# Patient Record
Sex: Female | Born: 1945 | Race: White | Hispanic: No | Smoking: Former smoker
Health system: Southern US, Community
[De-identification: ages and names within clinical notes are randomized; demographics above are authoritative.]

## PROBLEM LIST (undated history)

## (undated) DIAGNOSIS — F32A Depression, unspecified: Secondary | ICD-10-CM

## (undated) DIAGNOSIS — R51 Headache: Secondary | ICD-10-CM

## (undated) DIAGNOSIS — D699 Hemorrhagic condition, unspecified: Secondary | ICD-10-CM

## (undated) DIAGNOSIS — J449 Chronic obstructive pulmonary disease, unspecified: Secondary | ICD-10-CM

## (undated) DIAGNOSIS — Z87442 Personal history of urinary calculi: Secondary | ICD-10-CM

## (undated) DIAGNOSIS — E119 Type 2 diabetes mellitus without complications: Secondary | ICD-10-CM

## (undated) DIAGNOSIS — J45909 Unspecified asthma, uncomplicated: Secondary | ICD-10-CM

## (undated) DIAGNOSIS — I82409 Acute embolism and thrombosis of unspecified deep veins of unspecified lower extremity: Secondary | ICD-10-CM

## (undated) DIAGNOSIS — G4730 Sleep apnea, unspecified: Secondary | ICD-10-CM

## (undated) DIAGNOSIS — A4902 Methicillin resistant Staphylococcus aureus infection, unspecified site: Secondary | ICD-10-CM

## (undated) DIAGNOSIS — K219 Gastro-esophageal reflux disease without esophagitis: Secondary | ICD-10-CM

## (undated) DIAGNOSIS — M1990 Unspecified osteoarthritis, unspecified site: Secondary | ICD-10-CM

## (undated) DIAGNOSIS — Z85528 Personal history of other malignant neoplasm of kidney: Secondary | ICD-10-CM

## (undated) DIAGNOSIS — E785 Hyperlipidemia, unspecified: Secondary | ICD-10-CM

## (undated) DIAGNOSIS — R519 Headache, unspecified: Secondary | ICD-10-CM

## (undated) DIAGNOSIS — N200 Calculus of kidney: Secondary | ICD-10-CM

## (undated) DIAGNOSIS — I10 Essential (primary) hypertension: Secondary | ICD-10-CM

## (undated) DIAGNOSIS — C801 Malignant (primary) neoplasm, unspecified: Secondary | ICD-10-CM

## (undated) DIAGNOSIS — T4145XA Adverse effect of unspecified anesthetic, initial encounter: Secondary | ICD-10-CM

## (undated) DIAGNOSIS — F329 Major depressive disorder, single episode, unspecified: Secondary | ICD-10-CM

## (undated) HISTORY — DX: Sleep apnea, unspecified: G47.30

## (undated) HISTORY — DX: Personal history of other malignant neoplasm of kidney: Z85.528

## (undated) HISTORY — DX: Hemorrhagic condition, unspecified: D69.9

## (undated) HISTORY — DX: Calculus of kidney: N20.0

## (undated) HISTORY — DX: Major depressive disorder, single episode, unspecified: F32.9

## (undated) HISTORY — PX: LITHOTRIPSY: SUR834

## (undated) HISTORY — DX: Acute embolism and thrombosis of unspecified deep veins of unspecified lower extremity: I82.409

## (undated) HISTORY — PX: OTHER SURGICAL HISTORY: SHX169

## (undated) HISTORY — DX: Unspecified asthma, uncomplicated: J45.909

## (undated) HISTORY — PX: TUBAL LIGATION: SHX77

## (undated) HISTORY — DX: Type 2 diabetes mellitus without complications: E11.9

## (undated) HISTORY — PX: CHOLECYSTECTOMY: SHX55

## (undated) HISTORY — PX: CYSTOSCOPY W/ URETEROSCOPY W/ LITHOTRIPSY: SUR380

## (undated) HISTORY — DX: Hyperlipidemia, unspecified: E78.5

## (undated) HISTORY — DX: Unspecified osteoarthritis, unspecified site: M19.90

## (undated) HISTORY — DX: Depression, unspecified: F32.A

---

## 1998-11-17 DIAGNOSIS — T8859XA Other complications of anesthesia, initial encounter: Secondary | ICD-10-CM

## 1998-11-17 HISTORY — DX: Other complications of anesthesia, initial encounter: T88.59XA

## 2002-11-17 HISTORY — PX: FRACTURE SURGERY: SHX138

## 2005-03-19 ENCOUNTER — Emergency Department: Payer: Self-pay | Admitting: Emergency Medicine

## 2005-03-20 ENCOUNTER — Ambulatory Visit: Payer: Self-pay | Admitting: Urology

## 2005-03-26 ENCOUNTER — Ambulatory Visit: Payer: Self-pay | Admitting: Urology

## 2005-03-26 ENCOUNTER — Other Ambulatory Visit: Payer: Self-pay

## 2005-03-27 ENCOUNTER — Ambulatory Visit: Payer: Self-pay | Admitting: Urology

## 2005-04-10 ENCOUNTER — Ambulatory Visit: Payer: Self-pay

## 2005-09-15 ENCOUNTER — Emergency Department: Payer: Self-pay | Admitting: Emergency Medicine

## 2006-02-16 ENCOUNTER — Ambulatory Visit: Payer: Self-pay | Admitting: Family Medicine

## 2006-06-17 ENCOUNTER — Ambulatory Visit: Payer: Self-pay | Admitting: Emergency Medicine

## 2006-06-17 ENCOUNTER — Emergency Department: Payer: Self-pay | Admitting: Emergency Medicine

## 2006-06-19 ENCOUNTER — Ambulatory Visit: Payer: Self-pay | Admitting: Urology

## 2006-07-06 ENCOUNTER — Ambulatory Visit: Payer: Self-pay | Admitting: Urology

## 2006-07-16 ENCOUNTER — Ambulatory Visit: Payer: Self-pay | Admitting: Urology

## 2006-07-30 ENCOUNTER — Ambulatory Visit: Payer: Self-pay | Admitting: Urology

## 2006-08-31 ENCOUNTER — Ambulatory Visit: Payer: Self-pay | Admitting: Urology

## 2006-10-06 ENCOUNTER — Ambulatory Visit: Payer: Self-pay | Admitting: Urology

## 2007-04-13 ENCOUNTER — Ambulatory Visit: Payer: Self-pay | Admitting: Urology

## 2007-05-14 ENCOUNTER — Ambulatory Visit: Payer: Self-pay | Admitting: Urology

## 2007-05-25 ENCOUNTER — Ambulatory Visit: Payer: Self-pay | Admitting: Urology

## 2007-08-23 ENCOUNTER — Ambulatory Visit: Payer: Self-pay | Admitting: Urology

## 2007-10-20 ENCOUNTER — Ambulatory Visit: Payer: Self-pay | Admitting: Urology

## 2007-10-28 ENCOUNTER — Ambulatory Visit: Payer: Self-pay | Admitting: Otolaryngology

## 2007-11-12 ENCOUNTER — Ambulatory Visit: Payer: Self-pay | Admitting: Urology

## 2007-11-16 ENCOUNTER — Ambulatory Visit: Payer: Self-pay | Admitting: Cardiology

## 2007-11-16 ENCOUNTER — Ambulatory Visit: Payer: Self-pay | Admitting: Urology

## 2007-11-16 ENCOUNTER — Other Ambulatory Visit: Payer: Self-pay

## 2007-11-25 ENCOUNTER — Ambulatory Visit: Payer: Self-pay | Admitting: Urology

## 2007-12-09 ENCOUNTER — Ambulatory Visit: Payer: Self-pay | Admitting: Urology

## 2007-12-16 ENCOUNTER — Ambulatory Visit: Payer: Self-pay | Admitting: Urology

## 2007-12-17 ENCOUNTER — Ambulatory Visit: Payer: Self-pay | Admitting: Urology

## 2007-12-21 ENCOUNTER — Ambulatory Visit: Payer: Self-pay | Admitting: Urology

## 2007-12-23 ENCOUNTER — Ambulatory Visit: Payer: Self-pay | Admitting: Urology

## 2007-12-30 ENCOUNTER — Ambulatory Visit: Payer: Self-pay | Admitting: Urology

## 2008-01-27 ENCOUNTER — Ambulatory Visit: Payer: Self-pay | Admitting: Urology

## 2008-01-31 ENCOUNTER — Ambulatory Visit: Payer: Self-pay | Admitting: Urology

## 2008-02-03 ENCOUNTER — Ambulatory Visit: Payer: Self-pay | Admitting: Urology

## 2008-02-17 ENCOUNTER — Ambulatory Visit: Payer: Self-pay | Admitting: Urology

## 2008-02-21 ENCOUNTER — Ambulatory Visit: Payer: Self-pay | Admitting: Otolaryngology

## 2008-03-01 ENCOUNTER — Ambulatory Visit: Payer: Self-pay | Admitting: Otolaryngology

## 2008-03-20 ENCOUNTER — Other Ambulatory Visit: Payer: Self-pay

## 2008-03-21 ENCOUNTER — Inpatient Hospital Stay: Payer: Self-pay | Admitting: Urology

## 2008-03-30 ENCOUNTER — Ambulatory Visit: Payer: Self-pay | Admitting: Urology

## 2008-04-05 ENCOUNTER — Ambulatory Visit: Payer: Self-pay | Admitting: Urology

## 2008-04-20 ENCOUNTER — Ambulatory Visit: Payer: Self-pay | Admitting: Urology

## 2008-08-14 ENCOUNTER — Ambulatory Visit: Payer: Self-pay | Admitting: Urology

## 2008-09-04 ENCOUNTER — Ambulatory Visit: Payer: Self-pay | Admitting: Urology

## 2008-09-07 ENCOUNTER — Ambulatory Visit: Payer: Self-pay | Admitting: Urology

## 2008-09-21 ENCOUNTER — Ambulatory Visit: Payer: Self-pay | Admitting: Urology

## 2008-10-05 ENCOUNTER — Ambulatory Visit: Payer: Self-pay | Admitting: Urology

## 2008-10-18 ENCOUNTER — Ambulatory Visit: Payer: Self-pay | Admitting: Urology

## 2008-10-19 ENCOUNTER — Ambulatory Visit: Payer: Self-pay | Admitting: Urology

## 2008-10-26 ENCOUNTER — Ambulatory Visit: Payer: Self-pay | Admitting: Urology

## 2008-11-06 ENCOUNTER — Ambulatory Visit: Payer: Self-pay | Admitting: Urology

## 2008-11-17 DIAGNOSIS — I82409 Acute embolism and thrombosis of unspecified deep veins of unspecified lower extremity: Secondary | ICD-10-CM

## 2008-11-17 HISTORY — DX: Acute embolism and thrombosis of unspecified deep veins of unspecified lower extremity: I82.409

## 2008-12-06 ENCOUNTER — Ambulatory Visit: Payer: Self-pay | Admitting: Urology

## 2008-12-13 ENCOUNTER — Ambulatory Visit: Payer: Self-pay | Admitting: Urology

## 2008-12-28 ENCOUNTER — Ambulatory Visit: Payer: Self-pay | Admitting: Urology

## 2010-10-15 ENCOUNTER — Emergency Department: Payer: Self-pay | Admitting: Emergency Medicine

## 2010-10-19 ENCOUNTER — Emergency Department: Payer: Self-pay | Admitting: Emergency Medicine

## 2010-10-24 ENCOUNTER — Ambulatory Visit: Payer: Self-pay | Admitting: Urology

## 2010-12-06 ENCOUNTER — Emergency Department: Payer: Self-pay | Admitting: Internal Medicine

## 2011-01-07 ENCOUNTER — Ambulatory Visit: Payer: Self-pay | Admitting: Obstetrics and Gynecology

## 2011-01-17 ENCOUNTER — Ambulatory Visit: Payer: Self-pay | Admitting: Obstetrics and Gynecology

## 2011-09-09 ENCOUNTER — Ambulatory Visit: Payer: Self-pay | Admitting: Urology

## 2012-03-25 ENCOUNTER — Ambulatory Visit: Payer: Self-pay | Admitting: Family Medicine

## 2012-11-05 ENCOUNTER — Ambulatory Visit: Payer: Self-pay | Admitting: Orthopedic Surgery

## 2012-12-16 ENCOUNTER — Ambulatory Visit: Payer: Self-pay | Admitting: Internal Medicine

## 2012-12-18 ENCOUNTER — Ambulatory Visit: Payer: Self-pay | Admitting: Internal Medicine

## 2013-01-05 LAB — CREATININE, SERUM
Creatinine: 0.84 mg/dL (ref 0.60–1.30)
EGFR (African American): >60

## 2013-01-05 LAB — CANCER CTR PLATELET CT: Platelet: 321 x10 3/mm (ref 150–440)

## 2013-01-05 LAB — PROTIME-INR
INR: 1.8
Prothrombin Time: 21.1 secs — ABNORMAL HIGH (ref 11.5–14.7)

## 2013-01-10 ENCOUNTER — Ambulatory Visit: Payer: Self-pay | Admitting: Unknown Physician Specialty

## 2013-01-10 LAB — CBC WITH DIFFERENTIAL/PLATELET
Basophil %: 1.1 %
Eosinophil %: 2 %
HCT: 39.6 % (ref 35.0–47.0)
HGB: 13.2 g/dL (ref 12.0–16.0)
Lymphocyte #: 2.5 10*3/uL (ref 1.0–3.6)
MCH: 29.6 pg (ref 26.0–34.0)
Monocyte #: 0.4 x10 3/mm (ref 0.2–0.9)
Monocyte %: 6 %
Platelet: 282 10*3/uL (ref 150–440)
RBC: 4.46 10*6/uL (ref 3.80–5.20)
RDW: 13.7 % (ref 11.5–14.5)
WBC: 7.2 10*3/uL (ref 3.6–11.0)

## 2013-01-10 LAB — PROTIME-INR
INR: 1
Prothrombin Time: 13.2 secs (ref 11.5–14.7)

## 2013-01-13 LAB — CBC CANCER CENTER
Basophil #: 0.1 x10 3/mm (ref 0.0–0.1)
Basophil %: 0.9 %
Eosinophil #: 0.3 x10 3/mm (ref 0.0–0.7)
Eosinophil %: 3 %
HCT: 36.6 % (ref 35.0–47.0)
HGB: 12.3 g/dL (ref 12.0–16.0)
Lymphocyte #: 2.4 x10 3/mm (ref 1.0–3.6)
Lymphocyte %: 27.9 %
MCHC: 33.6 g/dL (ref 32.0–36.0)
Monocyte %: 6.5 %
Neutrophil #: 5.2 x10 3/mm (ref 1.4–6.5)
Neutrophil %: 61.7 %
RDW: 13.7 % (ref 11.5–14.5)
WBC: 8.5 x10 3/mm (ref 3.6–11.0)

## 2013-01-13 LAB — PROTIME-INR: Prothrombin Time: 14.6 secs (ref 11.5–14.7)

## 2013-01-15 ENCOUNTER — Ambulatory Visit: Payer: Self-pay | Admitting: Internal Medicine

## 2013-01-17 LAB — CANCER CTR PLATELET CT: Platelet: 258 x10 3/mm (ref 150–440)

## 2013-01-17 LAB — PROTIME-INR
INR: 1.3
Prothrombin Time: 16.5 secs — ABNORMAL HIGH (ref 11.5–14.7)

## 2013-01-18 LAB — PROTIME-INR
INR: 1.3
Prothrombin Time: 15.9 secs — ABNORMAL HIGH (ref 11.5–14.7)

## 2013-01-19 LAB — PROTIME-INR
INR: 1.4
Prothrombin Time: 16.8 secs — ABNORMAL HIGH (ref 11.5–14.7)

## 2013-01-20 LAB — PROTIME-INR: Prothrombin Time: 17.4 secs — ABNORMAL HIGH (ref 11.5–14.7)

## 2013-02-04 LAB — PROTIME-INR
INR: 1.5
Prothrombin Time: 17.8 secs — ABNORMAL HIGH (ref 11.5–14.7)

## 2013-02-10 LAB — PROTIME-INR
INR: 1.9
Prothrombin Time: 21.4 secs — ABNORMAL HIGH (ref 11.5–14.7)

## 2013-02-15 ENCOUNTER — Ambulatory Visit: Payer: Self-pay | Admitting: Internal Medicine

## 2013-02-15 LAB — PROTIME-INR
INR: 2.1
Prothrombin Time: 23.4 secs — ABNORMAL HIGH (ref 11.5–14.7)

## 2013-02-25 LAB — PROTIME-INR: Prothrombin Time: 29.1 secs — ABNORMAL HIGH (ref 11.5–14.7)

## 2013-03-03 LAB — PROTIME-INR: Prothrombin Time: 24.1 secs — ABNORMAL HIGH (ref 11.5–14.7)

## 2013-03-11 LAB — PROTIME-INR: INR: 2

## 2013-03-17 ENCOUNTER — Ambulatory Visit: Payer: Self-pay | Admitting: Internal Medicine

## 2013-04-04 ENCOUNTER — Ambulatory Visit: Payer: Self-pay | Admitting: Orthopedic Surgery

## 2013-04-04 LAB — BASIC METABOLIC PANEL
BUN: 17 mg/dL (ref 7–18)
Calcium, Total: 9.2 mg/dL (ref 8.5–10.1)
Chloride: 104 mmol/L (ref 98–107)
Co2: 33 mmol/L — ABNORMAL HIGH (ref 21–32)
EGFR (African American): >60
EGFR (Non-African Amer.): >60
Glucose: 130 mg/dL — ABNORMAL HIGH (ref 65–99)
Osmolality: 285 (ref 275–301)
Potassium: 3.7 mmol/L (ref 3.5–5.1)
Sodium: 141 mmol/L (ref 136–145)

## 2013-04-04 LAB — CBC
HGB: 12.1 g/dL (ref 12.0–16.0)
MCH: 29.9 pg (ref 26.0–34.0)
MCHC: 33.7 g/dL (ref 32.0–36.0)
MCV: 89 fL (ref 80–100)
Platelet: 261 10*3/uL (ref 150–440)
RBC: 4.06 10*6/uL (ref 3.80–5.20)
RDW: 13.9 % (ref 11.5–14.5)
WBC: 10.1 10*3/uL (ref 3.6–11.0)

## 2013-04-04 LAB — PROTIME-INR: Prothrombin Time: 26.5 secs — ABNORMAL HIGH (ref 11.5–14.7)

## 2013-04-04 LAB — APTT: Activated PTT: 45.7 secs — ABNORMAL HIGH (ref 23.6–35.9)

## 2013-04-07 ENCOUNTER — Ambulatory Visit: Payer: Self-pay | Admitting: Vascular Surgery

## 2013-04-12 ENCOUNTER — Inpatient Hospital Stay: Payer: Self-pay | Admitting: Orthopedic Surgery

## 2013-04-12 LAB — PROTIME-INR
INR: 0.9
Prothrombin Time: 12.8 secs (ref 11.5–14.7)

## 2013-04-13 LAB — BASIC METABOLIC PANEL
Anion Gap: 4 — ABNORMAL LOW (ref 7–16)
Calcium, Total: 8 mg/dL — ABNORMAL LOW (ref 8.5–10.1)
Co2: 32 mmol/L (ref 21–32)
Creatinine: 0.62 mg/dL (ref 0.60–1.30)
Osmolality: 281 (ref 275–301)

## 2013-04-13 LAB — PROTIME-INR
INR: 1.1
Prothrombin Time: 14.4 secs (ref 11.5–14.7)

## 2013-04-14 LAB — PROTIME-INR
INR: 1.1
Prothrombin Time: 14.4 secs (ref 11.5–14.7)

## 2013-04-15 LAB — PROTIME-INR: INR: 1.2

## 2013-04-16 ENCOUNTER — Other Ambulatory Visit: Payer: Self-pay | Admitting: Family Medicine

## 2013-04-16 LAB — PROTIME-INR: Prothrombin Time: 16.9 secs — ABNORMAL HIGH (ref 11.5–14.7)

## 2013-04-17 ENCOUNTER — Other Ambulatory Visit: Payer: Self-pay | Admitting: Family Medicine

## 2013-04-18 ENCOUNTER — Other Ambulatory Visit: Payer: Self-pay | Admitting: Family Medicine

## 2013-04-18 LAB — PROTIME-INR: Prothrombin Time: 17.9 secs — ABNORMAL HIGH (ref 11.5–14.7)

## 2013-04-21 ENCOUNTER — Other Ambulatory Visit: Payer: Self-pay | Admitting: Family Medicine

## 2013-04-21 LAB — COMPREHENSIVE METABOLIC PANEL
Alkaline Phosphatase: 121 U/L (ref 50–136)
Bilirubin,Total: 0.4 mg/dL (ref 0.2–1.0)
Calcium, Total: 8.9 mg/dL (ref 8.5–10.1)
Chloride: 101 mmol/L (ref 98–107)
Co2: 32 mmol/L (ref 21–32)
Creatinine: 0.67 mg/dL (ref 0.60–1.30)
EGFR (African American): >60
EGFR (Non-African Amer.): >60
Potassium: 3.8 mmol/L (ref 3.5–5.1)
SGOT(AST): 28 U/L (ref 15–37)
SGPT (ALT): 43 U/L (ref 12–78)

## 2013-04-21 LAB — CBC WITH DIFFERENTIAL/PLATELET
Basophil %: 0.6 %
Eosinophil #: 0.3 10*3/uL (ref 0.0–0.7)
Eosinophil %: 2.4 %
HCT: 32.1 % — ABNORMAL LOW (ref 35.0–47.0)
HGB: 10.6 g/dL — ABNORMAL LOW (ref 12.0–16.0)
MCHC: 33.1 g/dL (ref 32.0–36.0)
MCV: 90 fL (ref 80–100)
Monocyte #: 0.8 x10 3/mm (ref 0.2–0.9)
Monocyte %: 7.5 %
Neutrophil #: 7.3 10*3/uL — ABNORMAL HIGH (ref 1.4–6.5)
Neutrophil %: 66.8 %
RBC: 3.55 10*6/uL — ABNORMAL LOW (ref 3.80–5.20)
RDW: 14 % (ref 11.5–14.5)
WBC: 11 10*3/uL (ref 3.6–11.0)

## 2013-04-22 ENCOUNTER — Other Ambulatory Visit: Payer: Self-pay | Admitting: Family Medicine

## 2013-04-23 ENCOUNTER — Other Ambulatory Visit: Payer: Self-pay | Admitting: Family Medicine

## 2013-04-23 LAB — PROTIME-INR
INR: 1.6
Prothrombin Time: 19.1 s — ABNORMAL HIGH

## 2013-06-03 ENCOUNTER — Ambulatory Visit: Payer: Self-pay | Admitting: Orthopedic Surgery

## 2013-06-15 ENCOUNTER — Ambulatory Visit: Payer: Self-pay | Admitting: Urology

## 2013-06-16 DIAGNOSIS — N1330 Unspecified hydronephrosis: Secondary | ICD-10-CM | POA: Insufficient documentation

## 2013-07-21 DIAGNOSIS — N200 Calculus of kidney: Secondary | ICD-10-CM | POA: Insufficient documentation

## 2013-07-21 DIAGNOSIS — D4100 Neoplasm of uncertain behavior of unspecified kidney: Secondary | ICD-10-CM | POA: Insufficient documentation

## 2013-08-11 ENCOUNTER — Ambulatory Visit: Payer: Self-pay | Admitting: Urology

## 2013-09-07 ENCOUNTER — Ambulatory Visit: Payer: Self-pay | Admitting: Urology

## 2013-09-07 DIAGNOSIS — J449 Chronic obstructive pulmonary disease, unspecified: Secondary | ICD-10-CM

## 2013-09-07 LAB — CBC WITH DIFFERENTIAL/PLATELET
Basophil #: 0 10*3/uL (ref 0.0–0.1)
Basophil %: 0.6 %
HCT: 34.8 % — ABNORMAL LOW (ref 35.0–47.0)
HGB: 11.7 g/dL — ABNORMAL LOW (ref 12.0–16.0)
Lymphocyte %: 27.4 %
MCH: 29.5 pg (ref 26.0–34.0)
MCHC: 33.8 g/dL (ref 32.0–36.0)
Monocyte %: 6.9 %
Neutrophil #: 5.3 10*3/uL (ref 1.4–6.5)
Platelet: 271 10*3/uL (ref 150–440)
WBC: 8.4 10*3/uL (ref 3.6–11.0)

## 2013-09-07 LAB — BASIC METABOLIC PANEL
Anion Gap: 5 — ABNORMAL LOW (ref 7–16)
BUN: 13 mg/dL (ref 7–18)
Calcium, Total: 9.3 mg/dL (ref 8.5–10.1)
Chloride: 106 mmol/L (ref 98–107)
Co2: 31 mmol/L (ref 21–32)
Creatinine: 0.68 mg/dL (ref 0.60–1.30)
EGFR (African American): >60
EGFR (Non-African Amer.): >60
Potassium: 3.4 mmol/L — ABNORMAL LOW (ref 3.5–5.1)
Sodium: 142 mmol/L (ref 136–145)

## 2013-09-07 LAB — PROTIME-INR: INR: 1.7

## 2013-09-07 LAB — APTT: Activated PTT: 40.9 secs — ABNORMAL HIGH (ref 23.6–35.9)

## 2013-09-14 ENCOUNTER — Ambulatory Visit: Payer: Self-pay | Admitting: Urology

## 2013-09-14 LAB — PROTIME-INR
INR: 0.9
Prothrombin Time: 11.9 secs (ref 11.5–14.7)

## 2013-09-28 ENCOUNTER — Ambulatory Visit: Payer: Self-pay | Admitting: Urology

## 2013-10-17 ENCOUNTER — Ambulatory Visit: Payer: Self-pay | Admitting: Internal Medicine

## 2013-11-11 ENCOUNTER — Ambulatory Visit: Payer: Self-pay | Admitting: Internal Medicine

## 2013-11-14 LAB — CBC CANCER CENTER
Basophil %: 0.8 %
HGB: 12.1 g/dL (ref 12.0–16.0)
Lymphocyte #: 2.8 x10 3/mm (ref 1.0–3.6)
Lymphocyte %: 29.6 %
MCHC: 32.6 g/dL (ref 32.0–36.0)
MCV: 89 fL (ref 80–100)
Monocyte #: 0.6 x10 3/mm (ref 0.2–0.9)
Neutrophil #: 5.7 x10 3/mm (ref 1.4–6.5)
Platelet: 240 x10 3/mm (ref 150–440)
RBC: 4.18 10*6/uL (ref 3.80–5.20)
RDW: 14.5 % (ref 11.5–14.5)

## 2013-11-14 LAB — CREATININE, SERUM
Creatinine: 0.87 mg/dL (ref 0.60–1.30)
EGFR (African American): >60
EGFR (Non-African Amer.): >60

## 2013-11-16 DIAGNOSIS — I10 Essential (primary) hypertension: Secondary | ICD-10-CM | POA: Insufficient documentation

## 2013-11-16 DIAGNOSIS — K589 Irritable bowel syndrome without diarrhea: Secondary | ICD-10-CM | POA: Insufficient documentation

## 2013-11-16 DIAGNOSIS — F329 Major depressive disorder, single episode, unspecified: Secondary | ICD-10-CM | POA: Insufficient documentation

## 2013-11-16 DIAGNOSIS — I82409 Acute embolism and thrombosis of unspecified deep veins of unspecified lower extremity: Secondary | ICD-10-CM | POA: Insufficient documentation

## 2013-11-16 DIAGNOSIS — D6859 Other primary thrombophilia: Secondary | ICD-10-CM | POA: Insufficient documentation

## 2013-11-16 DIAGNOSIS — E119 Type 2 diabetes mellitus without complications: Secondary | ICD-10-CM | POA: Insufficient documentation

## 2013-11-16 DIAGNOSIS — J449 Chronic obstructive pulmonary disease, unspecified: Secondary | ICD-10-CM | POA: Insufficient documentation

## 2013-11-17 ENCOUNTER — Ambulatory Visit: Payer: Self-pay | Admitting: Internal Medicine

## 2013-11-21 LAB — CBC CANCER CENTER
BASOS ABS: 0.1 x10 3/mm (ref 0.0–0.1)
BASOS PCT: 0.9 %
Eosinophil #: 0.2 x10 3/mm (ref 0.0–0.7)
Eosinophil %: 2.6 %
HCT: 37.7 % (ref 35.0–47.0)
HGB: 12.2 g/dL (ref 12.0–16.0)
LYMPHS ABS: 2.4 x10 3/mm (ref 1.0–3.6)
LYMPHS PCT: 27.4 %
MCH: 28.6 pg (ref 26.0–34.0)
MCHC: 32.5 g/dL (ref 32.0–36.0)
MCV: 88 fL (ref 80–100)
MONOS PCT: 6.6 %
Monocyte #: 0.6 x10 3/mm (ref 0.2–0.9)
NEUTROS ABS: 5.4 x10 3/mm (ref 1.4–6.5)
Neutrophil %: 62.5 %
Platelet: 231 x10 3/mm (ref 150–440)
RBC: 4.28 10*6/uL (ref 3.80–5.20)
RDW: 14.6 % — AB (ref 11.5–14.5)
WBC: 8.6 x10 3/mm (ref 3.6–11.0)

## 2013-11-21 LAB — PROTIME-INR
INR: 1.1
Prothrombin Time: 13.6 secs (ref 11.5–14.7)

## 2013-11-21 LAB — CREATININE, SERUM
Creatinine: 0.82 mg/dL (ref 0.60–1.30)
EGFR (African American): >60
EGFR (Non-African Amer.): >60

## 2013-12-15 LAB — CBC CANCER CENTER
Basophil #: 0.1 x10 3/mm (ref 0.0–0.1)
Basophil %: 0.7 %
EOS PCT: 2.2 %
Eosinophil #: 0.2 x10 3/mm (ref 0.0–0.7)
HCT: 35 % (ref 35.0–47.0)
HGB: 11.2 g/dL — ABNORMAL LOW (ref 12.0–16.0)
LYMPHS PCT: 33.2 %
Lymphocyte #: 2.9 x10 3/mm (ref 1.0–3.6)
MCH: 28.7 pg (ref 26.0–34.0)
MCHC: 32.1 g/dL (ref 32.0–36.0)
MCV: 90 fL (ref 80–100)
MONOS PCT: 7.1 %
Monocyte #: 0.6 x10 3/mm (ref 0.2–0.9)
Neutrophil #: 5 x10 3/mm (ref 1.4–6.5)
Neutrophil %: 56.8 %
Platelet: 331 x10 3/mm (ref 150–440)
RBC: 3.9 10*6/uL (ref 3.80–5.20)
RDW: 15.1 % — ABNORMAL HIGH (ref 11.5–14.5)
WBC: 8.7 x10 3/mm (ref 3.6–11.0)

## 2013-12-15 LAB — PROTIME-INR
INR: 1
Prothrombin Time: 13.1 secs (ref 11.5–14.7)

## 2013-12-15 LAB — CREATININE, SERUM
CREATININE: 0.92 mg/dL (ref 0.60–1.30)
EGFR (African American): >60
EGFR (Non-African Amer.): >60

## 2013-12-18 ENCOUNTER — Ambulatory Visit: Payer: Self-pay | Admitting: Internal Medicine

## 2013-12-19 LAB — PROTIME-INR
INR: 1.1
Prothrombin Time: 14.1 secs (ref 11.5–14.7)

## 2013-12-19 LAB — CANCER CTR PLATELET CT: PLATELETS: 246 x10 3/mm (ref 150–440)

## 2013-12-21 LAB — CANCER CTR PLATELET CT: PLATELETS: 213 x10 3/mm (ref 150–440)

## 2013-12-21 LAB — CREATININE, SERUM: Creatinine: 0.97 mg/dL (ref 0.60–1.30)

## 2013-12-21 LAB — PROTIME-INR
INR: 1.1
PROTHROMBIN TIME: 14.5 s (ref 11.5–14.7)

## 2013-12-23 LAB — CANCER CTR PLATELET CT: Platelet: 185 x10 3/mm (ref 150–440)

## 2013-12-23 LAB — PROTIME-INR
INR: 1.3
PROTHROMBIN TIME: 15.6 s — AB (ref 11.5–14.7)

## 2013-12-26 LAB — PROTIME-INR
INR: 1.5
PROTHROMBIN TIME: 17.8 s — AB (ref 11.5–14.7)

## 2013-12-26 LAB — CANCER CTR PLATELET CT: PLATELETS: 196 x10 3/mm (ref 150–440)

## 2013-12-27 LAB — PROTIME-INR
INR: 1.4
Prothrombin Time: 16.9 secs — ABNORMAL HIGH (ref 11.5–14.7)

## 2013-12-29 LAB — PROTIME-INR
INR: 1.4
Prothrombin Time: 16.7 secs — ABNORMAL HIGH (ref 11.5–14.7)

## 2014-01-15 ENCOUNTER — Ambulatory Visit: Payer: Self-pay | Admitting: Internal Medicine

## 2014-02-01 ENCOUNTER — Ambulatory Visit: Payer: Self-pay | Admitting: Vascular Surgery

## 2014-02-01 LAB — PROTIME-INR
INR: 1.7
Prothrombin Time: 19.5 secs — ABNORMAL HIGH (ref 11.5–14.7)

## 2014-02-15 ENCOUNTER — Ambulatory Visit: Payer: Self-pay | Admitting: Urology

## 2014-04-13 ENCOUNTER — Other Ambulatory Visit (HOSPITAL_COMMUNITY): Payer: Self-pay | Admitting: Internal Medicine

## 2014-04-21 ENCOUNTER — Other Ambulatory Visit (HOSPITAL_COMMUNITY): Payer: Self-pay | Admitting: Internal Medicine

## 2014-05-24 DIAGNOSIS — M5416 Radiculopathy, lumbar region: Secondary | ICD-10-CM | POA: Insufficient documentation

## 2014-05-24 DIAGNOSIS — M48061 Spinal stenosis, lumbar region without neurogenic claudication: Secondary | ICD-10-CM | POA: Insufficient documentation

## 2014-11-17 HISTORY — PX: JOINT REPLACEMENT: SHX530

## 2015-03-09 NOTE — Consult Note (Signed)
PCP Dr Ulyses Amor Post op management of Dm-7metformin bid. increase dose as needed right TKR pod #23PTmeds per ortho DVT's in the past with h/o hypercoaguable (per pt Protein c def)resumedhas had IVC placed prior to surgery on may 22nd   elevated bp w/o dx of htnstart lisinopril pt and family   Electronic Signatures: Ilda Basset (MD) (Signed on 29-May-14 13:41)  Authored   Last Updated: 29-May-14 13:50 by Ilda Basset (MD)

## 2015-03-09 NOTE — Op Note (Signed)
PATIENT NAME:  SIMI, BRIEL MR#:  [id number] DATE OF BIRTH:  14-Jan-1946  DATE OF PROCEDURE:  04/07/2013  PREOPERATIVE DIAGNOSES: 1.  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  2.  Morbid obesity.  3.  Upcoming joint replacement surgery with high risk for thromboembolic events and need for temporary cessation of anticoagulation.   POSTOPERATIVE DIAGNOSES:  1.  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  2.  Morbid obesity.  3.  Upcoming joint replacement surgery with high risk for thromboembolic events and need for temporary cessation of anticoagulation.   PROCEDURES PERFORMED: 1.  Ultrasound guidance for vascular access, right jugular vein.  2.  Catheter placement into inferior vena cava from right jugular approach.  3.  Placement of an inferior vena cava filter from right jugular approach.   SURGEON: Leotis Pain, MD  ANESTHESIA: Local with moderate conscious sedation.   ESTIMATED BLOOD LOSS: Approximately 25 mL.  FLUOROSCOPY TIME: Approximately 2 minutes.   CONTRAST USED: 15 mL.   INDICATION FOR PROCEDURE: This is a 69 year old white female with upcoming joint replacement surgery.  She has a long history of thromboembolic events and will have to have her anticoagulation stopped for a short period and is at high risk for thromboembolic complications. We are asked to place an IVC filter. The risks and benefits were discussed. Informed consent was obtained.   DESCRIPTION OF PROCEDURE: The patient was brought to the vascular and interventional radiology suite. The right neck was sterilely prepped and draped and a sterile surgical field was created. The jugular vein was visualized with ultrasound and found to be widely patent. It was then accessed under direct ultrasound guidance without difficulty with a Seldinger needle. A J-wire was placed. After skin nick and dilatation, the delivery sheath was placed over the wire and parked  into the inferior vena cava. Inferior venacavogram was performed. This demonstrated a patent vena cava. The filter was then deployed at the L2-L3 interspace below the level of the renal veins. The delivery sheath was then removed, pressure was held, sterile dressing was placed. The patient tolerated the procedure well and was taken to the recovery room in stable condition. ____________________________ Algernon Huxley, MD jsd:sb D: 04/07/2013 10:09:22 ET T: 04/07/2013 10:17:21 ET JOB#: [id number]  cc: Algernon Huxley, MD, <Dictator> Algernon Huxley MD ELECTRONICALLY SIGNED 04/13/2013 13:53
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
## 2015-03-09 NOTE — Op Note (Signed)
PATIENT NAME:  Teresa Carr, Teresa Carr MR#:  [id number] DATE OF BIRTH:  1946-01-29  DATE OF PROCEDURE:  04/12/2013  PREOPERATIVE DIAGNOSIS: Severe right knee osteoarthritis.   POSTOPERATIVE DIAGNOSIS: Severe right knee osteoarthritis.   PROCEDURE: Right total knee replacement.   SURGEON: Laurene Footman, M.D.   ASSISTANT: Rachelle Hora, PA-C.   ANESTHESIA: Spinal.   DESCRIPTION OF PROCEDURE: The patient was brought to the Operating Room and after adequate anesthesia was obtained, the right leg was prepped and draped in usual sterile fashion with the Alvarado legholder utilized. After patient identification and timeout procedures were completed, the leg was exsanguinated with an Esmarch and the tourniquet raised to 300 mmHg. After skin incision was made, the tourniquet was raised to 350 mmHg, as there was inadequate tourniquet effect at 300. A medial parapatellar arthrotomy was carried out and inspection of the knee revealed significant bone loss in the patella, sclerosis of the trochlea and patella and medial compartment with bone loss in the medial compartment. Lateral compartment had significant loss of articular cartilage on the tibia, as well as extensive osteophytes around both femoral and tibial condyles. The ACL was excised along with the fat pad and the proximal tibia was exposed to allow for application of the Medacta cutting block. After applying this, the proximal tibia cut was carried out and the resections matched the model. The residual meniscus was removed at this time. The femoral cutting block was applied in a similar fashion and distal femoral cuts were made, followed by the 5 distal femur cutting block which was then utilized to make anterior, posterior and chamfer cuts. There was no notching of the distal femur. The PCL was released as it was quite tight after placement of the #4 tibia tray, as the #5 appeared to overhang medially. With appropriate rotation based on a pin off the initial  guide, the V cut was placed to hold the tibial trial in place, followed by the #5 femur. Sequential placement of polyethylene components was made and the 17 gave excellent stability in flexion and mid flexion. Distal femoral drill holes were made along with the notch cut for the trochlea. At this point, the patella was resected using the guide and measured a size 3 after drilling holes. The patella tracked well with the no touch technique. All trial components were removed at this time and the bony surfaces thoroughly irrigated. The tourniquet was let down to make sure there was no excessive bleeding and there was none. The knee was then infiltrated with Exparel diluted in saline around the entire joint and periosteum. After this, the tourniquet was raised again, the knee thoroughly irrigated and dried. The tibial component was cemented into place first, followed by the polyethylene  component insertion with excess cement removed posteriorly, followed by the femoral component. The knee was held in extension as the patellar button was clamped into place. Next, the excess cement was removed as the cement had set and the knee was again irrigated with pulsatile lavage. The tourniquet was let down again to check for bleeding. The arthrotomy was repaired using multiple Ethibond sutures along the patella, followed by a heavy quill suture to seal the joint, 2-0 quill subcutaneously and skin staples. Sensorcaine 0.5% and morphine was also utilized, infiltrating around the periphery of the joint to aid in postoperative analgesia. A wound VAC was applied with Mepitel over the staples to minimize hematoma formation, as the leg was quite large, and the patient was going to Coumadin postoperatively. There were  no complications.   SPECIMEN: Cut ends of bone.   IMPLANTS: Medacta size 4 GMK primary right tibial tray with a right standard femur; a GMK size 4 tibial insert thickness, 17 mm; and a size 3 primary resurfacing patella  GMK system. All components were cemented with antibiotic cement.  DISPOSITION: The patient was sent to the recovery room in stable condition.   TOURNIQUET TIME: 93 minutes at 350 mmHg.   ESTIMATED BLOOD LOSS: 200 mL.    ____________________________ Laurene Footman, MD mjm:jm D: 04/12/2013 16:32:53 ET T: 04/12/2013 19:26:21 ET JOB#: [id number]  cc: Laurene Footman, MD, <Dictator> Laurene Footman MD ELECTRONICALLY SIGNED 04/13/2013 12:20

## 2015-03-09 NOTE — Op Note (Signed)
PATIENT NAME:  Teresa Carr, Teresa Carr MR#:  [id number] DATE OF BIRTH:  09/16/1946  DATE OF PROCEDURE:  09/14/2013  PREOPERATIVE DIAGNOSES: 1.  Left proximal ureteral calculus.  2.  Left renal calculus.   POSTOPERATIVE DIAGNOSIS: 1.  Left proximal ureteral calculus.  2.  Left renal calculus.   PROCEDURES: 1.  Left ureteroscopy with holmium laser lithotripsy/stone extraction.  2.  Left ureteropyeloscopy.  3.  Placement of left ureteral stent.   SURGEON: John Giovanni, M.D.   ASSISTANT: None.   ANESTHESIA: General.   INDICATIONS: This is a 69 year old female with a history of recurrent stone disease. She was incidentally found to have a 9 mm left proximal ureteral calculus on MRI of the lumbar spine. Subsequent CT showed a 9 mm calculus with severe hydronephrosis and hydroureter. She has been minimally symptomatic, but there has been no stone progression on serial KUBs. She was incidentally discovered to have a 4 cm right renal mass and has been evaluated at [facility] in [place]. She presents today for definitive treatment of her left-sided stone. She does have some left renal atrophy.    DESCRIPTION OF PROCEDURE: The patient was taken to the cystoscopy suite and placed on the table in supine position. A general anesthetic was administered via an endotracheal tube. She was then placed in the low lithotomy position, and her external genitalia were prepped and draped in the usual fashion. A timeout was performed per protocol. A 21-French cystoscope with obturator was lubricated and passed per urethra. Panendoscopy was performed. There was clear efflux from the right ureteral orifice. No efflux was seen from the left ureteral orifice. There was a moderate amount sediment in the bladder, which was irrigated clear. No bladder mucosal lesions were identified. A 0.035 guidewire was placed through the cystoscope and into the left ureteral orifice. A wire was easily passed up into the left renal pelvis  under fluoroscopic guidance. The cystoscope was removed. A 6-French semirigid ureteroscope was passed per urethra. The left ureteral orifice was engaged without dilation and easily passed up the proximal ureter where the stone was identified. A 365 micron holmium laser fiber was placed through the ureteroscope. The stone was used fragmented with small fragments removed with a 3-French nitinol basket. There was an approximately 4 mm fragment that migrated to the kidney. A second guidewire was placed, and then the semirigid ureteroscope was removed. A flexible ureteropyeloscope was placed over the second wire and easily passed up into the renal pelvis. Renal pelvis and calyces were markedly dilated. The stone was visualized and grasped with a basket and removed, although hung in the distal ureter. The holmium laser fiber was placed through the ureteroscope and the stone was fragmented and basket and stone fragments were removed. The ureteroscope was repassed up the renal pelvis. The left renal calcification was easily seen on fluoroscopy; however, it could not be visualized with close examination of the calyces. With the tip of the scope right at the calcification under fluoroscopic guidance, the stone was not  visualized, and it was felt this was most likely in the renal parenchyma. The ureteroscope was removed. A 6-French/24 cm Microvasive Contour ureteral stent was placed over the wire. There was good curl seen in the renal pelvis on fluoroscopy. The distal end of the stent was well positioned in the bladder on repassage of the cystoscope. The bladder was emptied, and the cystoscope was removed. A B and O suppository was placed per rectum. The patient was taken to PACU in  stable condition. There were no complications. EBL was minimal.      ____________________________ Ronda Fairly. Bernardo Heater, MD scs:dmm D: 09/15/2013 19:30:04 ET T: 09/15/2013 19:50:50 ET JOB#: [id number]  cc: Nicki Reaper C. Bernardo Heater, MD, <Dictator> Abbie Sons MD ELECTRONICALLY SIGNED 09/21/2013 8:45

## 2015-03-09 NOTE — Discharge Summary (Signed)
PATIENT NAME:  Teresa, Carr MR#:  [id number] DATE OF BIRTH:  July 01, 1946  DATE OF ADMISSION:  04/12/2013 DATE OF DISCHARGE:  04/15/2013   ADMITTING DIAGNOSIS: Degenerative arthritis, right knee.   DISCHARGE DIAGNOSIS: Total knee arthroplasty, right knee.   SURGEON: Hessie Knows, MD   ASSISTANT: Rachelle Hora, PA-C   ANESTHESIA: Spinal.  ESTIMATED BLOOD LOSS: 200 mL.   TOURNIQUET TIME: 93 minutes at 350 mmHg.   SPECIMEN: Cut ends of bone.   OPERATIVE FINDINGS: Severe knee degenerative joint disease.   DRAINS: None.   HISTORY OF PRESENT ILLNESS: The patient is a 69 year old female with severe right knee osteoarthritis who has been scheduled for a knee replacement. She had my knee CT. She has had her vena cava filter scheduled to be put in within the next week and she will stop Coumadin a week prior to her surgery. She has had exhaustive nonoperative treatment including injections and bracing as well as exercise program. She has debilitating pain and has a great deal of difficulty walking outside of her home.   PHYSICAL EXAMINATION: LUNGS: Clear.  HEART: Regular rate and rhythm.  HEENT: She has upper and lower dentures.  NECK: Full neck range of motion.  RIGHT KNEE: Examination of right knee shows the patient has  1+ edema in both lower extremities. She has trace dorsalis pedis and posterior tibialis pulse. She is able to flex and extend the toes and sensation is intact. She has range of motion of 10 to 110 degrees with varus deformity that is not passively correctable.   HOSPITAL COURSE: The patient was admitted to the hospital on 04/12/2013. She had surgery that same day and was brought to the orthopedic floor from the PACU in stable condition. On postop day 1, the patient was doing well. She had moderate pain. She was started on Lovenox and Coumadin and vital signs were stable. On postop day 2, the patient was doing well from an orthopedic standpoint. She was consulted for type 2  diabetes management and the patient was resumed on metformin b.i.d. as well as sliding scale insulin for tighter coverage postoperatively. She was also started on lisinopril 5 mg daily for her hypertension. On postop day 3, the patient was doing well. Her INR was not quite therapeutic at 1.1, but she was continued with Lovenox and Coumadin. She had slow progress with therapy and she agreed to being discharged to rehab facility to continue working on therapy for her right knee.   CONDITION AT DISCHARGE: Stable.   DISCHARGE INSTRUCTIONS: She may gradually increase weight-bearing on the affected extremity. She is to elevate the affected foot or leg on 1 or 2 pillows with the foot higher than the knee. She needs to wear thigh-high TED hose on both legs and remove 1 hour prior to 8-hour shift and elevate the heels off the bed. She needs to use incentive spirometry every hour while awake and encourage cough and deep breathing. She may resume a regular diet as tolerated. She needs to apply an ice pack to the affected area. Continue using Polar Care unit, maintaining temperature between 40 and 50 degrees. Do not get the dressing or bandage wet or dirty. Call [facility] if the dressing gets water under it. Leave the dressing on. Call [facility] if any of the following occur: Bright red bleeding from the incision or wound, fever above 101.5 degrees, redness, swelling or drainage at the incision. Call [facility] if you experience any increased leg  pain, numbness or weakness in your legs or bowel or bladder symptoms. Physical therapy per protocol has been ordered. She should also have an INR checked daily until therapeutic at 2.0. Once therapeutic, please discontinue the Lovenox and continue with Coumadin. Call [facility] for appointment in 2 weeks.   DISCHARGE MEDICATIONS: Gabapentin 300 mg oral capsule 1 cap orally 2 times a day, simvastatin 40 mg oral  tablet 1 tablet orally once in the morning, fluoxetine 20 mg oral capsule 1 cap orally once a day, metformin 750 mg oral tablet extended-release 1 tablet orally 2 times a day, warfarin 7.5 mg oral tablet 1 tab orally once a day in the morning except for Monday/warfarin 1 mg oral tablet 7-1/2 tabs orally once a day, Tylenol 500 mg oral tablet 1 tablet orally every 3 hours as needed for pain or temperature greater than 100.4, oxycodone 5 mg oral tablet 1 tablet orally every 4 hours as needed for pain, Nucynta 50 mg oral tablet 1 tablet orally every 4 hours as needed for pain, lisinopril 5 mg oral tablet 1 tablet orally once a day, magnesium hydroxide/simethicone 400 mcg/400 mcg/40 mcg per 5 mL oral suspension 30 mL orally every 6 hours as needed for indigestion or heartburn, Lovenox 40 mg subcutaneous every 12 hours until INR is therapeutic, insulin regular 100 international units/mL human recombinant injectable solution subcutaneous FSBS before meals and at bedtime, non-formulary medication 750 mg orally 2 times a day with meals, multiple vitamins oral tablet 1 tablet orally once a day, bisacodyl 10 mg rectal suppository 1 suppository rectally once a day as needed for constipation, docusate/senna 50mg  /8.6 mg oral tablet 1 tablet orally 2 times a day.   ____________________________ T. Rachelle Hora, PA-C tcg:jm D: 04/15/2013 15:16:19 ET T: 04/15/2013 15:54:12 ET JOB#: [id number]  cc: T. Rachelle Hora, PA-C, <Dictator> Duanne Guess [place] ELECTRONICALLY SIGNED 04/24/2013 2:37

## 2015-03-10 NOTE — Op Note (Signed)
PATIENT NAME:  Teresa Carr, Teresa Carr MR#:  [id number] DATE OF BIRTH:  1946-06-14  DATE OF PROCEDURE:  02/01/2014  PREOPERATIVE DIAGNOSES:  1. Status post inferior vena cava filter placement.  2. History of deep venous thrombosis.  3. History of multiple orthopedic operations.  4. Obesity.   POSTOPERATIVE DIAGNOSES:  1. Status post inferior vena cava filter placement.  2. History of deep venous thrombosis.  3. History of multiple orthopedic operations.  4. Obesity.   PROCEDURES:  1. Ultrasound guidance for vascular access, right jugular vein.  2. Catheter placement into inferior vena cava from right jugular venous approach.  3. Inferior venacavogram.  4. Retrieval of [employer] [facility] IVC filter.   SURGEON: Algernon Huxley, M.D.   ANESTHESIA: Local with moderate conscious sedation.   BLOOD LOSS: 25 mL.   FLUOROSCOPY TIME: One minute.   CONTRAST USED: 15 mL.   INDICATION FOR PROCEDURE: A 69 year old white female with previous IVC filter placement. This was done prior to a major orthopedic surgery last year. This was going to be removed but then she had an additional surgery about 6 months ago, and the filter was left in place. She has recovered from this and has no current deep venous thrombosis symptoms. She has a history of multiple previous DVTs and remains on Coumadin. Filter retrieval was performed to avoid the small but real risk of problems with long-term indwelling IVC filter. Risks and benefits were discussed. Informed consent was obtained.   DESCRIPTION OF PROCEDURE: The patient is brought to the vascular suite. The right neck was sterilely prepped and draped, and a sterile surgical field was created. The right jugular vein was visualized on ultrasound and found to be widely patent. It was then accessed under direct ultrasound guidance without difficulty with a Seldinger needle. A J-wire was then placed.  After skin nick and dilatation, the retrieval sheath was placed into the inferior  vena cava, and an inferior venacavogram was performed. This demonstrated a patent IVC with no thrombosis with the filter in excellent location just below the renal veins. At this point, I used the retrieval system to snare the hook on the top of the filter, advanced the sheath over the filter, collapsing it and removed the filter in its entirety through the sheath. The sheath and delivery system were then removed. Pressure was held on the neck, and a sterile dressing was placed. The patient tolerated the procedure well and was taken to the recovery room in stable condition.   ____________________________ Algernon Huxley, MD jsd:gb D: 02/01/2014 15:18:57 ET T: 02/01/2014 21:46:52 ET JOB#: [id number]  cc: Algernon Huxley, MD, <Dictator> Algernon Huxley MD ELECTRONICALLY SIGNED 02/20/2014 9:44

## 2015-06-12 DIAGNOSIS — M5126 Other intervertebral disc displacement, lumbar region: Secondary | ICD-10-CM | POA: Insufficient documentation

## 2015-06-22 DIAGNOSIS — E785 Hyperlipidemia, unspecified: Secondary | ICD-10-CM | POA: Insufficient documentation

## 2015-11-18 DIAGNOSIS — A4902 Methicillin resistant Staphylococcus aureus infection, unspecified site: Secondary | ICD-10-CM

## 2015-11-18 HISTORY — DX: Methicillin resistant Staphylococcus aureus infection, unspecified site: A49.02

## 2016-12-11 ENCOUNTER — Other Ambulatory Visit: Payer: Self-pay | Admitting: Family Medicine

## 2016-12-11 ENCOUNTER — Ambulatory Visit
Admission: RE | Admit: 2016-12-11 | Discharge: 2016-12-11 | Disposition: A | Discharge status: Home / Self Care | Payer: Medicare Other | Source: Ambulatory Visit | Attending: Family Medicine | Admitting: Family Medicine

## 2016-12-11 DIAGNOSIS — M7989 Other specified soft tissue disorders: Secondary | ICD-10-CM | POA: Insufficient documentation

## 2016-12-11 DIAGNOSIS — M79661 Pain in right lower leg: Secondary | ICD-10-CM | POA: Diagnosis present

## 2017-05-25 DIAGNOSIS — C649 Malignant neoplasm of unspecified kidney, except renal pelvis: Secondary | ICD-10-CM | POA: Insufficient documentation

## 2017-06-11 ENCOUNTER — Ambulatory Visit: Payer: Self-pay | Admitting: Urology

## 2017-06-14 NOTE — Progress Notes (Deleted)
06/15/2017 3:39 PM   Teresa Carr Feb 23, 1946 [id number]  Referring provider: Juluis Pitch, MD [phone] [place], [place] [address]  No chief complaint on file.   HPI: Patient is a *** -year-old *** female who is referred to [place] by, ***, for urinary incontinence.  Patient states that she has had urinary incontinence for ***.  Patient has incontinence with ***.   She is experiencing *** incontinent episodes during the day. She is experiencing *** incontinent episodes during the night.  Her incontinence volume is ***.   She is wearing *** pads/depends daily.    She is having associated urinary frequency, urgency, dysuria, nocturia, intermittency, hesitancy, straining to urinate and weak urinary stream.   ***  She is not having associated urinary frequency, urgency, dysuria, nocturia, intermittency, hesitancy, straining to urinate and weak urinary stream.   ***  She does/does not have a history of urinary tract infections, STI's or injury to the bladder. ***  She denies/endorses dysuria, gross hematuria, suprapubic pain, back pain, abdominal pain or flank pain.***  She denies/endorses dysuria, gross hematuria, suprapubic pain, back pain, abdominal pain or flank pain.***  She has not had any recent fevers, chills, nausea or vomiting. ***  She has not had any recent fevers, chills, nausea or vomiting. ***  She does/does not have a history of nephrolithiasis, GU surgery or GU trauma. ***  She does/does not have a history of nephrolithiasis, GU surgery or GU trauma. ***  She is/is not sexually active.  She has/has not noted incontinence with sexual intercourse.  ***   She is post menopausal. ***  She admits to/denies constipation and/or diarrhea. ***  She is having/ not having pain with bladder filling.  ***  She has/not had any recent imaging studies.  ***  She is drinking *** of water daily.   She is drinking *** caffeinated beverages daily.  She is drinking ***  alcoholic beverages daily.    Her risk factors for incontinence are obesity, para T, vaginal delivery, a family history of incontinence, age, smoking, caffeine, diabetes, stroke, depression, fecal incontinence, vaginal atrophy, oral estrogens, pelvic surgery, pelvic radiation and/or neurological disorders. ***  She is taking antihistamines, decongestants, benzo's, opioids, OAB medication, ACE inhibitors, alpha-agonists, alpha blockers, antiarrhythmics, diuretics, antidepressants, antipsychotics, skeletal muscle relaxants and/or oral estrogen.       Reviewed referral notes.    PMH: No past medical history on file.  Surgical History: No past surgical history on file.  Home Medications:  Allergies as of 06/15/2017   Not on File     Medication List    as of 06/14/2017  3:39 PM   You have not been prescribed any medications.     Allergies: Allergies not on file  Family History: No family history on file.  Social History:  has no tobacco, alcohol, and drug history on file.  ROS:                                        Physical Exam: There were no vitals taken for this visit.  Constitutional: Well nourished. Alert and oriented, No acute distress. HEENT: [place] AT, moist mucus membranes. Trachea midline, no masses. Cardiovascular: No clubbing, cyanosis, or edema. Respiratory: Normal respiratory effort, no increased work of breathing. GI: Abdomen is soft, non tender, non distended, no abdominal masses. Liver and spleen not palpable.  No hernias appreciated.  Stool  sample for occult testing is not indicated.   GU: No CVA tenderness.  No bladder fullness or masses.  Normal external genitalia, normal pubic hair distribution, no lesions.  Normal urethral meatus, no lesions, no prolapse, no discharge.   No urethral masses, tenderness and/or tenderness. No bladder fullness, tenderness or masses. Normal vagina mucosa, good estrogen effect, no discharge, no lesions,  good pelvic support, no cystocele or rectocele noted.  No cervical motion tenderness.  Uterus is freely mobile and non-fixed.  No adnexal/parametria masses or tenderness noted.  Anus and perineum are without rashes or lesions.   *** Skin: No rashes, bruises or suspicious lesions. Lymph: No cervical or inguinal adenopathy. Neurologic: Grossly intact, no focal deficits, moving all 4 extremities. Psychiatric: Normal mood and affect.  Laboratory Data:  Urinalysis No results found for: COLORURINE, APPEARANCEUR, LABSPEC, [place], GLUCOSEU, HGBUR, BILIRUBINUR, KETONESUR, PROTEINUR, UROBILINOGEN, NITRITE, LEUKOCYTESUR  I have independently reviewed the labs.   Pertinent Imaging: *** I have independently reviewed the films.    Assessment & Plan:  ***  Incontinence  - offered behavioral therapies  - bladder training  - bladder control strategies  - pelvic floor muscle training  - fluid management   - offered medical therapy with anticholinergic therapy or beta-3 adrenergic receptor agonist and the potential side effects of each therapy ***  - offered refer to gynecology for a pessary fitting ***  - offered an appointment with one of our surgeon for a possible pelvic sling procedure ***  - would like to try the beta-3 adrenergic receptor agonist (Myrbetriq).  Given Myrbetriq 25 mg samples, #28.  I have reviewed with the patient of the side effects of Myrbetriq, such as: elevation in BP, urinary retention and/or HA.  She will return in one month for PVR and symptom recheck.  ***  - would like to try anticholinergic therapy.  Given Vesicare 5mg /10mg , Toviaz 4mg /8mg  samples, # 28.   Advised of the side effects, such as: Dry eyes, dry mouth, constipation, mental confusion and/or urinary retention. ***  - RTC in 3 weeks for PVR and symptom recheck ***     No Follow-up on file.  These notes generated with voice recognition software. I apologize for typographical errors.  Zara Council,  [facility] [address], [place], [place] [address] [phone]

## 2017-06-15 ENCOUNTER — Encounter: Payer: Self-pay | Admitting: Urology

## 2017-06-15 ENCOUNTER — Ambulatory Visit: Payer: Self-pay | Admitting: Urology
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
## 2017-08-18 ENCOUNTER — Ambulatory Visit (INDEPENDENT_AMBULATORY_CARE_PROVIDER_SITE_OTHER): Payer: Medicare Other | Admitting: Urology

## 2017-08-18 ENCOUNTER — Encounter: Payer: Self-pay | Admitting: Urology

## 2017-08-18 VITALS — BP 144/68 | HR 105 | Ht 68.0 in | Wt 353.0 lb

## 2017-08-18 DIAGNOSIS — R8271 Bacteriuria: Secondary | ICD-10-CM

## 2017-08-18 DIAGNOSIS — N200 Calculus of kidney: Secondary | ICD-10-CM | POA: Diagnosis not present

## 2017-08-18 DIAGNOSIS — Z85528 Personal history of other malignant neoplasm of kidney: Secondary | ICD-10-CM

## 2017-08-18 DIAGNOSIS — N3946 Mixed incontinence: Secondary | ICD-10-CM | POA: Diagnosis not present

## 2017-08-18 LAB — MICROSCOPIC EXAMINATION

## 2017-08-18 LAB — URINALYSIS, COMPLETE
Bilirubin, UA: NEGATIVE
GLUCOSE, UA: NEGATIVE
KETONES UA: NEGATIVE
Nitrite, UA: POSITIVE — AB
SPEC GRAV UA: 1.02 (ref 1.005–1.030)
UUROB: 0.2 mg/dL (ref 0.2–1.0)
pH, UA: 5.5 (ref 5.0–7.5)

## 2017-08-18 NOTE — Progress Notes (Signed)
In and Out Catheterization  Patient is present today for a I & O catheterization due to Bacteriuria. Patient was cleaned and prepped in a sterile fashion with betadine and Lidocaine 2% jelly was instilled into the urethra.  A 14FR cath was inserted no complications were noted , 73ml of urine return was noted, urine was yellow in color. A clean urine sample was collected for urinalysis. Bladder was drained and catheter was removed with out difficulty.    Preformed by: Lyndee Hensen CMA
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
## 2017-09-08 ENCOUNTER — Encounter: Payer: Self-pay | Admitting: Urology

## 2017-09-08 ENCOUNTER — Ambulatory Visit (INDEPENDENT_AMBULATORY_CARE_PROVIDER_SITE_OTHER): Payer: Medicare Other | Admitting: Urology

## 2017-09-08 VITALS — BP 141/67 | HR 106 | Ht 69.0 in | Wt 357.5 lb

## 2017-09-08 DIAGNOSIS — N200 Calculus of kidney: Secondary | ICD-10-CM | POA: Diagnosis not present

## 2017-09-08 DIAGNOSIS — R8271 Bacteriuria: Secondary | ICD-10-CM

## 2017-09-08 DIAGNOSIS — N390 Urinary tract infection, site not specified: Secondary | ICD-10-CM

## 2017-09-08 DIAGNOSIS — N3946 Mixed incontinence: Secondary | ICD-10-CM | POA: Diagnosis not present

## 2017-09-08 DIAGNOSIS — Z85528 Personal history of other malignant neoplasm of kidney: Secondary | ICD-10-CM

## 2017-09-08 DIAGNOSIS — R319 Hematuria, unspecified: Secondary | ICD-10-CM | POA: Diagnosis not present

## 2017-09-08 LAB — URINALYSIS, COMPLETE
Bilirubin, UA: NEGATIVE
Glucose, UA: NEGATIVE
Ketones, UA: NEGATIVE
NITRITE UA: POSITIVE — AB
PH UA: 7 (ref 5.0–7.5)
Specific Gravity, UA: 1.015 (ref 1.005–1.030)
UUROB: 0.2 mg/dL (ref 0.2–1.0)

## 2017-09-08 LAB — MICROSCOPIC EXAMINATION

## 2017-09-08 MED ORDER — AMOXICILLIN-POT CLAVULANATE 875-125 MG PO TABS: 1.0000 | ORAL_TABLET | Freq: Two times a day (BID) | ORAL | 0 refills | Status: DC

## 2017-09-08 NOTE — Patient Instructions (Addendum)
PLEASE BRING YOUR Zeb OF THE CT OF YOUR KIDNEYS DONE IN July 2018

## 2017-09-12 LAB — CULTURE, URINE COMPREHENSIVE

## 2017-09-14 ENCOUNTER — Telehealth: Payer: Self-pay

## 2017-09-14 NOTE — Telephone Encounter (Signed)
LMOM- ucx negative 

## 2017-09-14 NOTE — Telephone Encounter (Signed)
-----   Message from Nori Riis, PA-C sent at 09/13/2017  7:59 PM EDT ----- Please let Mrs. Desa know that her urine culture is negative.

## 2017-09-24 DIAGNOSIS — M170 Bilateral primary osteoarthritis of knee: Secondary | ICD-10-CM | POA: Insufficient documentation

## 2017-09-24 DIAGNOSIS — L409 Psoriasis, unspecified: Secondary | ICD-10-CM | POA: Insufficient documentation
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
## 2017-09-29 ENCOUNTER — Encounter: Payer: Self-pay | Admitting: Urology

## 2017-09-29 ENCOUNTER — Ambulatory Visit (INDEPENDENT_AMBULATORY_CARE_PROVIDER_SITE_OTHER): Payer: Medicare Other | Admitting: Urology

## 2017-09-29 VITALS — BP 149/70 | HR 102 | Ht 69.0 in | Wt 356.2 lb

## 2017-09-29 DIAGNOSIS — N3946 Mixed incontinence: Secondary | ICD-10-CM

## 2017-09-29 DIAGNOSIS — N3001 Acute cystitis with hematuria: Secondary | ICD-10-CM

## 2017-09-29 DIAGNOSIS — Z85528 Personal history of other malignant neoplasm of kidney: Secondary | ICD-10-CM

## 2017-09-29 DIAGNOSIS — N200 Calculus of kidney: Secondary | ICD-10-CM | POA: Diagnosis not present

## 2017-09-29 DIAGNOSIS — R300 Dysuria: Secondary | ICD-10-CM

## 2017-09-29 LAB — URINALYSIS, COMPLETE
Bilirubin, UA: NEGATIVE
Glucose, UA: NEGATIVE
KETONES UA: NEGATIVE
Nitrite, UA: POSITIVE — AB
SPEC GRAV UA: 1.025 (ref 1.005–1.030)
Urobilinogen, Ur: 0.2 mg/dL (ref 0.2–1.0)
pH, UA: 5.5 (ref 5.0–7.5)

## 2017-09-29 LAB — MICROSCOPIC EXAMINATION: WBC, UA: >30 /hpf — ABNORMAL HIGH (ref 0–?)

## 2017-09-29 LAB — BLADDER SCAN AMB NON-IMAGING: Scan Result: 28

## 2017-09-29 MED ORDER — AMOXICILLIN-POT CLAVULANATE 875-125 MG PO TABS: 1.0000 | ORAL_TABLET | Freq: Two times a day (BID) | ORAL | 0 refills | Status: DC

## 2017-10-01 LAB — CULTURE, URINE COMPREHENSIVE

## 2017-10-02 ENCOUNTER — Telehealth: Payer: Self-pay

## 2017-10-02 MED ORDER — CIPROFLOXACIN HCL 250 MG PO TABS: 250.0000 mg | ORAL_TABLET | Freq: Two times a day (BID) | ORAL | 0 refills | Status: DC

## 2017-10-02 NOTE — Telephone Encounter (Signed)
-----   Message from Nori Riis, PA-C sent at 10/01/2017  9:23 PM EST ----- Please let Teresa Carr know that her urine culture was positive.  She needs to stop the Augmentin and start Cipro 250 mg bid x 7 days.

## 2017-10-02 NOTE — Telephone Encounter (Signed)
LMOM- abx sent to pharmacy 

## 2017-10-09 ENCOUNTER — Telehealth: Payer: Self-pay | Admitting: Urology

## 2017-10-09 NOTE — Telephone Encounter (Signed)
Teresa Carr needs a follow up appointment in 2 weeks for symptom recheck on UTI.

## 2017-10-12 NOTE — Telephone Encounter (Signed)
LMOM

## 2017-10-14 NOTE — Telephone Encounter (Signed)
Not able to get in touch with pt. Will send a letter.

## 2017-10-26 ENCOUNTER — Ambulatory Visit: Payer: Medicare Other | Admitting: Urology

## 2017-11-03 ENCOUNTER — Ambulatory Visit (INDEPENDENT_AMBULATORY_CARE_PROVIDER_SITE_OTHER): Payer: Medicare Other | Admitting: Urology

## 2017-11-03 ENCOUNTER — Other Ambulatory Visit: Payer: Self-pay

## 2017-11-03 ENCOUNTER — Encounter: Payer: Self-pay | Admitting: Urology

## 2017-11-03 VITALS — BP 147/74 | HR 82 | Ht 69.0 in | Wt 353.0 lb

## 2017-11-03 DIAGNOSIS — IMO0001 Reserved for inherently not codable concepts without codable children: Secondary | ICD-10-CM

## 2017-11-03 DIAGNOSIS — E1165 Type 2 diabetes mellitus with hyperglycemia: Secondary | ICD-10-CM

## 2017-11-03 DIAGNOSIS — Z87440 Personal history of urinary (tract) infections: Secondary | ICD-10-CM | POA: Diagnosis not present

## 2017-11-03 DIAGNOSIS — Z85528 Personal history of other malignant neoplasm of kidney: Secondary | ICD-10-CM

## 2017-11-03 DIAGNOSIS — N200 Calculus of kidney: Secondary | ICD-10-CM

## 2017-11-03 DIAGNOSIS — N3946 Mixed incontinence: Secondary | ICD-10-CM | POA: Diagnosis not present

## 2017-11-03 LAB — URINALYSIS, COMPLETE
Bilirubin, UA: NEGATIVE
Glucose, UA: NEGATIVE
Ketones, UA: NEGATIVE
Nitrite, UA: POSITIVE — AB
PH UA: 5.5 (ref 5.0–7.5)
Specific Gravity, UA: 1.02 (ref 1.005–1.030)
UUROB: 0.2 mg/dL (ref 0.2–1.0)

## 2017-11-03 LAB — MICROSCOPIC EXAMINATION

## 2017-11-03 MED ORDER — MIRABEGRON ER 50 MG PO TB24: 50.0000 mg | ORAL_TABLET | Freq: Every day | ORAL | 3 refills | Status: DC
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
## 2017-11-06 LAB — URINE CULTURE
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
## 2018-02-02 ENCOUNTER — Ambulatory Visit (INDEPENDENT_AMBULATORY_CARE_PROVIDER_SITE_OTHER): Payer: Medicare Other | Admitting: Urology

## 2018-02-02 ENCOUNTER — Encounter: Payer: Self-pay | Admitting: Urology

## 2018-02-02 ENCOUNTER — Telehealth: Payer: Self-pay | Admitting: Urology

## 2018-02-02 VITALS — BP 130/72 | HR 108 | Resp 16 | Ht 68.0 in | Wt 360.0 lb

## 2018-02-02 DIAGNOSIS — N3946 Mixed incontinence: Secondary | ICD-10-CM | POA: Diagnosis not present

## 2018-02-02 DIAGNOSIS — Z85528 Personal history of other malignant neoplasm of kidney: Secondary | ICD-10-CM

## 2018-02-02 DIAGNOSIS — E119 Type 2 diabetes mellitus without complications: Secondary | ICD-10-CM | POA: Diagnosis not present

## 2018-02-02 LAB — BLADDER SCAN AMB NON-IMAGING: Scan Result: 0

## 2018-02-02 NOTE — Telephone Encounter (Signed)
Would you check to see if PTNS is covered by her insurance?

## 2018-02-09 NOTE — Telephone Encounter (Signed)
Per [facility] PTNS is covered 100% Do you want me to call and schedule her?  Sharyn Lull

## 2018-02-09 NOTE — Telephone Encounter (Signed)
Yes, please schedule her for PTNS.

## 2018-02-24 ENCOUNTER — Encounter: Payer: Self-pay | Admitting: Dietician

## 2018-02-24 ENCOUNTER — Encounter: Payer: Medicare Other | Attending: Urology | Admitting: Dietician

## 2018-02-24 VITALS — Ht 68.0 in | Wt 360.0 lb

## 2018-02-24 DIAGNOSIS — E119 Type 2 diabetes mellitus without complications: Secondary | ICD-10-CM | POA: Diagnosis not present

## 2018-02-24 DIAGNOSIS — Z794 Long term (current) use of insulin: Secondary | ICD-10-CM

## 2018-02-24 DIAGNOSIS — Z6841 Body Mass Index (BMI) 40.0 and over, adult: Secondary | ICD-10-CM | POA: Insufficient documentation

## 2018-02-24 DIAGNOSIS — Z713 Dietary counseling and surveillance: Secondary | ICD-10-CM | POA: Diagnosis present

## 2018-02-24 NOTE — Patient Instructions (Signed)
   Change back to drinking diet soda, keep up water as much as possible.   Begin using exercise bike for a few minutes at a time as tolerated.
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
## 2018-03-03 ENCOUNTER — Ambulatory Visit (INDEPENDENT_AMBULATORY_CARE_PROVIDER_SITE_OTHER): Payer: Medicare Other | Admitting: Family Medicine

## 2018-03-03 DIAGNOSIS — N3946 Mixed incontinence: Secondary | ICD-10-CM | POA: Diagnosis not present

## 2018-03-03 NOTE — Progress Notes (Signed)
PTNS  Session # 1  Health & Social Factors: Urge incontinence Caffeine: 1 Alcohol: 0 Daytime voids #per day: 6 Night-time voids #per night: 2 Urgency: strong Incontinence Episodes #per day: 3 Ankle used: right Treatment Setting: 5 Feeling/ Response: both Comments: patient tolerated well  Preformed By: Elberta Leatherwood, CMA   Follow Up: 1 week #2

## 2018-03-10 ENCOUNTER — Ambulatory Visit (INDEPENDENT_AMBULATORY_CARE_PROVIDER_SITE_OTHER): Payer: Medicare Other | Admitting: Family Medicine

## 2018-03-10 DIAGNOSIS — N3946 Mixed incontinence: Secondary | ICD-10-CM

## 2018-03-10 NOTE — Progress Notes (Signed)
PTNS  Session # 2  Health & Social Factors: no change Caffeine: 3 Alcohol: 0 Daytime voids #per day: 4 Night-time voids #per night: 2 Urgency: strong Incontinence Episodes #per day: 2 Ankle used: right Treatment Setting: 5 Feeling/ Response: Both Comments: Patient tolerated well.  Preformed By: Elberta Leatherwood, CMA  Follow  Up: 1 week

## 2018-03-11 ENCOUNTER — Ambulatory Visit
Admission: RE | Admit: 2018-03-11 | Discharge: 2018-03-11 | Disposition: A | Discharge status: Home / Self Care | Payer: Medicare Other | Source: Ambulatory Visit | Attending: Family Medicine | Admitting: Family Medicine

## 2018-03-11 ENCOUNTER — Other Ambulatory Visit: Payer: Self-pay | Admitting: Family Medicine

## 2018-03-11 DIAGNOSIS — R52 Pain, unspecified: Secondary | ICD-10-CM

## 2018-03-11 DIAGNOSIS — M25511 Pain in right shoulder: Secondary | ICD-10-CM | POA: Diagnosis present

## 2018-03-12 ENCOUNTER — Other Ambulatory Visit: Payer: Self-pay | Admitting: Family Medicine

## 2018-03-12 DIAGNOSIS — Z1239 Encounter for other screening for malignant neoplasm of breast: Secondary | ICD-10-CM

## 2018-03-16 ENCOUNTER — Ambulatory Visit: Payer: Medicare Other | Attending: Nurse Practitioner | Admitting: Nurse Practitioner

## 2018-03-16 ENCOUNTER — Encounter: Payer: Self-pay | Admitting: Nurse Practitioner

## 2018-03-16 VITALS — BP 162/70 | HR 91 | Temp 98.1°F | Resp 16 | Ht 70.0 in | Wt 353.0 lb

## 2018-03-16 DIAGNOSIS — M899 Disorder of bone, unspecified: Secondary | ICD-10-CM | POA: Diagnosis not present

## 2018-03-16 DIAGNOSIS — M25571 Pain in right ankle and joints of right foot: Secondary | ICD-10-CM | POA: Insufficient documentation

## 2018-03-16 DIAGNOSIS — N200 Calculus of kidney: Secondary | ICD-10-CM | POA: Diagnosis not present

## 2018-03-16 DIAGNOSIS — Z86718 Personal history of other venous thrombosis and embolism: Secondary | ICD-10-CM | POA: Diagnosis not present

## 2018-03-16 DIAGNOSIS — G4730 Sleep apnea, unspecified: Secondary | ICD-10-CM | POA: Insufficient documentation

## 2018-03-16 DIAGNOSIS — Z87891 Personal history of nicotine dependence: Secondary | ICD-10-CM | POA: Insufficient documentation

## 2018-03-16 DIAGNOSIS — N1330 Unspecified hydronephrosis: Secondary | ICD-10-CM | POA: Insufficient documentation

## 2018-03-16 DIAGNOSIS — M5442 Lumbago with sciatica, left side: Secondary | ICD-10-CM | POA: Diagnosis not present

## 2018-03-16 DIAGNOSIS — M48061 Spinal stenosis, lumbar region without neurogenic claudication: Secondary | ICD-10-CM | POA: Diagnosis not present

## 2018-03-16 DIAGNOSIS — Z789 Other specified health status: Secondary | ICD-10-CM

## 2018-03-16 DIAGNOSIS — M79605 Pain in left leg: Secondary | ICD-10-CM | POA: Diagnosis not present

## 2018-03-16 DIAGNOSIS — M25562 Pain in left knee: Secondary | ICD-10-CM | POA: Insufficient documentation

## 2018-03-16 DIAGNOSIS — M25552 Pain in left hip: Secondary | ICD-10-CM | POA: Diagnosis not present

## 2018-03-16 DIAGNOSIS — Z79899 Other long term (current) drug therapy: Secondary | ICD-10-CM

## 2018-03-16 DIAGNOSIS — I10 Essential (primary) hypertension: Secondary | ICD-10-CM | POA: Insufficient documentation

## 2018-03-16 DIAGNOSIS — M25572 Pain in left ankle and joints of left foot: Secondary | ICD-10-CM | POA: Diagnosis not present

## 2018-03-16 DIAGNOSIS — G894 Chronic pain syndrome: Secondary | ICD-10-CM | POA: Diagnosis not present

## 2018-03-16 DIAGNOSIS — D6859 Other primary thrombophilia: Secondary | ICD-10-CM | POA: Insufficient documentation

## 2018-03-16 DIAGNOSIS — Z882 Allergy status to sulfonamides status: Secondary | ICD-10-CM | POA: Insufficient documentation

## 2018-03-16 DIAGNOSIS — Z9851 Tubal ligation status: Secondary | ICD-10-CM | POA: Insufficient documentation

## 2018-03-16 DIAGNOSIS — Z794 Long term (current) use of insulin: Secondary | ICD-10-CM | POA: Insufficient documentation

## 2018-03-16 DIAGNOSIS — Z79891 Long term (current) use of opiate analgesic: Secondary | ICD-10-CM

## 2018-03-16 DIAGNOSIS — M79604 Pain in right leg: Secondary | ICD-10-CM | POA: Insufficient documentation

## 2018-03-16 DIAGNOSIS — G8929 Other chronic pain: Secondary | ICD-10-CM | POA: Insufficient documentation

## 2018-03-16 DIAGNOSIS — J449 Chronic obstructive pulmonary disease, unspecified: Secondary | ICD-10-CM | POA: Insufficient documentation

## 2018-03-16 DIAGNOSIS — E785 Hyperlipidemia, unspecified: Secondary | ICD-10-CM | POA: Diagnosis not present

## 2018-03-16 DIAGNOSIS — Z85528 Personal history of other malignant neoplasm of kidney: Secondary | ICD-10-CM | POA: Insufficient documentation

## 2018-03-16 DIAGNOSIS — Z7901 Long term (current) use of anticoagulants: Secondary | ICD-10-CM | POA: Insufficient documentation

## 2018-03-16 DIAGNOSIS — M25511 Pain in right shoulder: Secondary | ICD-10-CM | POA: Diagnosis not present

## 2018-03-16 DIAGNOSIS — F3289 Other specified depressive episodes: Secondary | ICD-10-CM | POA: Insufficient documentation

## 2018-03-16 DIAGNOSIS — E119 Type 2 diabetes mellitus without complications: Secondary | ICD-10-CM | POA: Insufficient documentation

## 2018-03-16 DIAGNOSIS — Z9049 Acquired absence of other specified parts of digestive tract: Secondary | ICD-10-CM | POA: Insufficient documentation

## 2018-03-16 DIAGNOSIS — K589 Irritable bowel syndrome without diarrhea: Secondary | ICD-10-CM | POA: Diagnosis not present

## 2018-03-16 DIAGNOSIS — M170 Bilateral primary osteoarthritis of knee: Secondary | ICD-10-CM | POA: Insufficient documentation

## 2018-03-16 DIAGNOSIS — M25512 Pain in left shoulder: Secondary | ICD-10-CM | POA: Insufficient documentation

## 2018-03-16 DIAGNOSIS — Z87442 Personal history of urinary calculi: Secondary | ICD-10-CM | POA: Insufficient documentation

## 2018-03-16 DIAGNOSIS — M25561 Pain in right knee: Secondary | ICD-10-CM | POA: Insufficient documentation

## 2018-03-16 DIAGNOSIS — E890 Postprocedural hypothyroidism: Secondary | ICD-10-CM | POA: Insufficient documentation

## 2018-03-16 DIAGNOSIS — Z96651 Presence of right artificial knee joint: Secondary | ICD-10-CM | POA: Diagnosis not present

## 2018-03-16 DIAGNOSIS — L409 Psoriasis, unspecified: Secondary | ICD-10-CM | POA: Insufficient documentation

## 2018-03-16 DIAGNOSIS — Z9889 Other specified postprocedural states: Secondary | ICD-10-CM | POA: Insufficient documentation

## 2018-03-16 DIAGNOSIS — M5441 Lumbago with sciatica, right side: Secondary | ICD-10-CM | POA: Insufficient documentation

## 2018-03-16 DIAGNOSIS — M5416 Radiculopathy, lumbar region: Secondary | ICD-10-CM | POA: Insufficient documentation

## 2018-03-16 DIAGNOSIS — M5126 Other intervertebral disc displacement, lumbar region: Secondary | ICD-10-CM | POA: Insufficient documentation

## 2018-03-16 NOTE — Progress Notes (Signed)
Nursing Pain Medication Assessment:  Safety precautions to be maintained throughout the outpatient stay will include: orient to surroundings, keep bed in low position, maintain call bell within reach at all times, provide assistance with transfer out of bed and ambulation.  Medication Inspection Compliance: Pill count conducted under aseptic conditions, in front of the patient. Neither the pills nor the bottle was removed from the patient's sight at any time. Once count was completed pills were immediately returned to the patient in their original bottle.  Medication #1: Oxycodone/APAP Pill/Patch Count: 63 of 120 pills remain Pill/Patch Appearance: Markings consistent with prescribed medication Bottle Appearance: Standard pharmacy container. Clearly labeled. Filled Date: 37 / 20 / 2018 Last Medication intake:  Saturday  Medication #2: Morphine ER (MSContin) Pill/Patch Count: 28 of 90 pills remain Pill/Patch Appearance: Markings consistent with prescribed medication Bottle Appearance: Standard pharmacy container. Clearly labeled. Filled Date: 04 / 04 / 2019 Last Medication intake:  Today

## 2018-03-16 NOTE — Progress Notes (Signed)
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 that she would be a candidate for opioid analgesics. The decision will be made following CDC guidelines. This decision will be based on the results of diagnostic studies, as well as Ms. Acero's risk profile.  Membrane stabilizer: To be determined at a later time Muscle relaxant: To be determined at a later time NSAID: To be determined at a later time Other analgesic(s): To be determined at a later time   Interventional therapies under consideration: Ms. Blas was informed that there is no guarantee that she would be a candidate for interventional therapies. The decision will be based on the results of diagnostic studies, as well as Ms. Schleyer's risk profile.  Possible procedure(s): Diagnostic bilateral lumbar epidural steroid injection  Diagnostic bilateral lumbar facet nerve block Possible bilateral lumbar facet RFA Diagnostic left knee intra-articular steroid injection Diagnostic left knee Hyalgan series Diagnostic bilateral genicular nerve blocks Possible bilateral genicular nerve RFA Diagnostic right intra-articular shoulder injection Diagnostic right suprascapular nerve  block Possible right suprascapular nerve RFA   Provider-requested follow-up: Return for 2nd Visit, w/ Dr. Dossie Arbour, after [facility] eval.  Future Appointments  Date Time Provider [place]  03/17/2018 10:30 AM [facility]-NURSE [facility] None  03/24/2018 10:30 AM [facility]-NURSE [facility] None  03/31/2018 10:30 AM McGowan, Larene Beach A, PA-C [facility] None  04/07/2018 10:30 AM [facility]NURSE [facility] None  04/09/2018  2:20 PM [facility]  04/14/2018 10:30 AM McGowan, Larene Beach A, PA-C [facility] None  04/21/2018 10:30 AM McGowan, Larene Beach A, PA-C [facility] None  04/28/2018 10:30 AM McGowan, Larene Beach A, PA-C [facility] None  04/28/2018  1:15 PM Georgina Peer, RD [facility] None  05/05/2018 10:45 AM Ernestine Conrad, Larene Beach A, PA-C [facility] None  05/12/2018 10:30 AM Ernestine Conrad, Hunt Oris, PA-C [facility] None  05/19/2018 10:30 AM McGowan, Hunt Oris, PA-C [facility] None  02/04/2019 11:30 AM McGowan, Marlowe Kays None    Primary Care Physician: Juluis Pitch, MD Location: [facility] Note by:  Date: 03/16/2018; Time: 4:01 PM  Pain Score Disclaimer: We use the NRS-11 scale. This is a self-reported, subjective measurement of pain severity with only modest accuracy. It is used primarily to identify changes within a particular patient. It must be understood that outpatient pain scales are significantly less accurate that those used for research, where they can be applied under ideal controlled circumstances with minimal exposure to variables. In reality, the score is likely to be a combination of pain intensity and pain affect, where pain affect describes the degree of emotional arousal or changes in action readiness caused by the sensory experience of pain. Factors such as social and work situation, setting, emotional state, anxiety levels, expectation, and prior pain experience may influence pain perception and show large inter-individual differences that may also be affected by time variables.  Patient  instructions provided during this appointment: Patient Instructions   ____________________________________________________________________________________________  Appointment Policy Summary  It is our goal and responsibility to provide the medical community with assistance in the evaluation and management of patients with chronic pain. Unfortunately our resources are limited. Because we do not have an unlimited amount of time, or available appointments, we are required to closely monitor and manage their use. The following rules exist to maximize their use:  Patient's responsibilities: 1. Punctuality:  At what time should I arrive? You should be physically present in our office 30 minutes before your scheduled appointment. Your scheduled appointment is with your assigned healthcare provider. However, it takes 5-10 minutes to be "checked-in", and another 15 minutes for the nurses to do the admission. If you arrive to our office at the time you were given for your appointment, you will end up being at least 20-25 minutes late to your appointment with the provider. 2. Tardiness:  What happens if I arrive only a few minutes after my scheduled appointment time? You will need to reschedule your appointment. The cutoff is your appointment time. This is why it is so important that you arrive at least 30 minutes before that appointment. If you have an appointment scheduled for 10:00 AM and you arrive at 10:01, you will be required to reschedule your appointment.  3. Plan ahead:  Always assume that you will encounter traffic on your way in. Plan for it.  If you are dependent on a driver, make sure they understand these rules and the need to arrive early. 4. Other appointments and responsibilities:  Avoid scheduling any other appointments before or after your pain clinic appointments.  5. Be prepared:  Write down everything that you need to discuss with your healthcare provider and give this information to  the admitting nurse. Write down the medications that you will need refilled. Bring your pills and bottles (even the empty ones), to all of your appointments, except for those where a procedure is scheduled. 6. No children or pets:  Find someone to take care of them. It is not appropriate to bring them in. 7. Scheduling changes:  We request "advanced notification" of any changes or cancellations. 8. Advanced notification:  Defined as a time period of more than 24 hours prior to the originally scheduled appointment. This allows for the appointment to be offered to other patients. 9. Rescheduling:  When a visit is rescheduled, it will require the cancellation of the original appointment. For this reason they both fall within the category of "Cancellations".  10. Cancellations:  They require advanced notification. Any cancellation less than 24 hours before the  appointment will be recorded as a "No Show". 11. No Show:  Defined as an unkept appointment where the patient failed to notify or declare to the practice their intention or inability to keep the appointment.  Corrective process for repeat offenders:  1. Tardiness: Three (3) episodes of rescheduling due to late arrivals will be recorded as one (1) "No Show". 2. Cancellation or reschedule: Three (3) cancellations or rescheduling will be recorded as one (1) "No Show". 3. "No Shows": Three (3) "No Shows" within a 12 month period will result in discharge from the practice. ____________________________________________________________________________________________  ____________________________________________________________________________________________  Pain Scale  Introduction: The pain score used by this practice is the Verbal Numerical Rating Scale (VNRS-11). This is an 11-point scale. It is for adults and children 10 years or older. There are significant differences in how the pain score is reported, used, and applied. Forget everything  you learned in the past and learn this scoring system.  General Information: The scale should reflect your current level of pain. Unless you are specifically asked for the level of your worst pain, or your average pain. If you are asked for one of these two, then it should be understood that it is over the past 24 hours.  Basic Activities of Daily Living (ADL): Personal hygiene, dressing, eating, transferring, and using restroom.  Instructions: Most patients tend to report their level of pain as a combination of two factors, their physical pain and their psychosocial pain. This last one is also known as "suffering" and it is reflection of how physical pain affects you socially and psychologically. From now on, report them separately. From this point on, when asked to report your pain level, report only your physical pain. Use the following table for reference.  Pain Clinic Pain Levels (0-5/10)  Pain Level Score  Description  No Pain 0   Mild pain 1 Nagging, annoying, but does not interfere with basic activities of daily living (ADL). Patients are able to eat, bathe, get dressed, toileting (being able to get on and off the toilet and perform personal hygiene functions), transfer (move in and out of bed or a chair without assistance), and maintain continence (able to control bladder and bowel functions). Blood pressure and heart rate are unaffected. A normal heart rate for a healthy adult ranges from  60 to 100 bpm (beats per minute).   Mild to moderate pain 2 Noticeable and distracting. Impossible to hide from other people. More frequent flare-ups. Still possible to adapt and function close to normal. It can be very annoying and may have occasional stronger flare-ups. With discipline, patients may get used to it and adapt.   Moderate pain 3 Interferes significantly with activities of daily living (ADL). It becomes difficult to feed, bathe, get dressed, get on and off the toilet or to perform personal  hygiene functions. Difficult to get in and out of bed or a chair without assistance. Very distracting. With effort, it can be ignored when deeply involved in activities.   Moderately severe pain 4 Impossible to ignore for more than a few minutes. With effort, patients may still be able to manage work or participate in some social activities. Very difficult to concentrate. Signs of autonomic nervous system discharge are evident: dilated pupils (mydriasis); mild sweating (diaphoresis); sleep interference. Heart rate becomes elevated (>115 bpm). Diastolic blood pressure (lower number) rises above 100 mmHg. Patients find relief in laying down and not moving.   Severe pain 5 Intense and extremely unpleasant. Associated with frowning face and frequent crying. Pain overwhelms the senses.  Ability to do any activity or maintain social relationships becomes significantly limited. Conversation becomes difficult. Pacing back and forth is common, as getting into a comfortable position is nearly impossible. Pain wakes you up from deep sleep. Physical signs will be obvious: pupillary dilation; increased sweating; goosebumps; brisk reflexes; cold, clammy hands and feet; nausea, vomiting or dry heaves; loss of appetite; significant sleep disturbance with inability to fall asleep or to remain asleep. When persistent, significant weight loss is observed due to the complete loss of appetite and sleep deprivation.  Blood pressure and heart rate becomes significantly elevated. Caution: If elevated blood pressure triggers a pounding headache associated with blurred vision, then the patient should immediately seek attention at an urgent or emergency care unit, as these may be signs of an impending stroke.    Emergency Department Pain Levels (6-10/10)  Emergency Room Pain 6 Severely limiting. Requires emergency care and should not be seen or managed at an outpatient pain management facility. Communication becomes difficult and  requires great effort. Assistance to reach the emergency department may be required. Facial flushing and profuse sweating along with potentially dangerous increases in heart rate and blood pressure will be evident.   Distressing pain 7 Self-care is very difficult. Assistance is required to transport, or use restroom. Assistance to reach the emergency department will be required. Tasks requiring coordination, such as bathing and getting dressed become very difficult.   Disabling pain 8 Self-care is no longer possible. At this level, pain is disabling. The individual is unable to do even the most "basic" activities such as walking, eating, bathing, dressing, transferring to a bed, or toileting. Fine motor skills are lost. It is difficult to think clearly.   Incapacitating pain 9 Pain becomes incapacitating. Thought processing is no longer possible. Difficult to remember your own name. Control of movement and coordination are lost.   The worst pain imaginable 10 At this level, most patients pass out from pain. When this level is reached, collapse of the autonomic nervous system occurs, leading to a sudden drop in blood pressure and heart rate. This in turn results in a temporary and dramatic drop in blood flow to the brain, leading to a loss of consciousness. Fainting is one of the body's self defense mechanisms.  Passing out puts the brain in a calmed state and causes it to shut down for a while, in order to begin the healing process.    Summary: 1. Refer to this scale when providing [place] with your pain level. 2. Be accurate and careful when reporting your pain level. This will help with your care. 3. Over-reporting your pain level will lead to loss of credibility. 4. Even a level of 1/10 means that there is pain and will be treated at our facility. 5. High, inaccurate reporting will be documented as "Symptom Exaggeration", leading to loss of credibility and suspicions of possible secondary gains such as  obtaining more narcotics, or wanting to appear disabled, for fraudulent reasons. 6. Only pain levels of 5 or below will be seen at our facility. 7. Pain levels of 6 and above will be sent to the Emergency Department and the appointment cancelled. ____________________________________________________________________________________________

## 2018-03-16 NOTE — Progress Notes (Deleted)
Safety precautions to be maintained throughout the outpatient stay will include: orient to surroundings, keep bed in low position, maintain call bell within reach at all times, provide assistance with transfer out of bed and ambulation.  

## 2018-03-16 NOTE — Patient Instructions (Signed)

## 2018-03-17 ENCOUNTER — Encounter: Payer: Self-pay | Admitting: Nurse Practitioner

## 2018-03-17 ENCOUNTER — Other Ambulatory Visit: Payer: Self-pay | Admitting: Nurse Practitioner

## 2018-03-17 ENCOUNTER — Ambulatory Visit (INDEPENDENT_AMBULATORY_CARE_PROVIDER_SITE_OTHER): Payer: Medicare Other

## 2018-03-17 DIAGNOSIS — R700 Elevated erythrocyte sedimentation rate: Secondary | ICD-10-CM | POA: Insufficient documentation

## 2018-03-17 DIAGNOSIS — R7982 Elevated C-reactive protein (CRP): Secondary | ICD-10-CM | POA: Insufficient documentation

## 2018-03-17 DIAGNOSIS — N3941 Urge incontinence: Secondary | ICD-10-CM

## 2018-03-17 NOTE — Progress Notes (Signed)
PTNS  Session # 3  Health & Social Factors: patient states she did not feel well last week Caffeine: 3 Alcohol: 0 Daytime voids #per day: 4 Night-time voids #per night: 2 Urgency: mild Incontinence Episodes #per day: 2- night Ankle used: right Treatment Setting: 6 Feeling/ Response: both Comments: none  Preformed By: Fonnie Jarvis, CMA    Follow Up: 1 week

## 2018-03-20 LAB — COMPLIANCE DRUG ANALYSIS, UR

## 2018-03-22 LAB — COMP. METABOLIC PANEL (12)
ALBUMIN: 3.9 g/dL (ref 3.5–4.8)
AST: 48 IU/L — ABNORMAL HIGH (ref 0–40)
Albumin/Globulin Ratio: 1.1 — ABNORMAL LOW (ref 1.2–2.2)
Alkaline Phosphatase: 78 IU/L (ref 39–117)
BILIRUBIN TOTAL: 0.3 mg/dL (ref 0.0–1.2)
BUN / CREAT RATIO: 19 (ref 12–28)
BUN: 14 mg/dL (ref 8–27)
CALCIUM: 9.6 mg/dL (ref 8.7–10.3)
CREATININE: 0.74 mg/dL (ref 0.57–1.00)
Chloride: 99 mmol/L (ref 96–106)
GFR calc Af Amer: 95 mL/min/{1.73_m2} (ref >59)
GFR, EST NON AFRICAN AMERICAN: 82 mL/min/{1.73_m2} (ref >59)
Globulin, Total: 3.6 g/dL (ref 1.5–4.5)
Glucose: 149 mg/dL — ABNORMAL HIGH (ref 65–99)
Potassium: 4.4 mmol/L (ref 3.5–5.2)
SODIUM: 144 mmol/L (ref 134–144)
Total Protein: 7.5 g/dL (ref 6.0–8.5)

## 2018-03-22 LAB — SEDIMENTATION RATE: Sed Rate: 107 mm/hr — ABNORMAL HIGH (ref 0–40)

## 2018-03-22 LAB — MAGNESIUM: MAGNESIUM: 1.6 mg/dL (ref 1.6–2.3)

## 2018-03-22 LAB — 25-HYDROXYVITAMIN D LCMS D2+D3
25-HYDROXY, VITAMIN D-2: 1.3 ng/mL
25-HYDROXY, VITAMIN D-3: 47 ng/mL
25-HYDROXY, VITAMIN D: 48 ng/mL

## 2018-03-22 LAB — C-REACTIVE PROTEIN: CRP: 9.9 mg/L — AB (ref 0.0–4.9)

## 2018-03-22 LAB — VITAMIN B12: Vitamin B-12: 799 pg/mL (ref 232–1245)

## 2018-03-24 ENCOUNTER — Ambulatory Visit: Payer: Medicare Other

## 2018-03-31 ENCOUNTER — Ambulatory Visit: Payer: Medicare Other | Admitting: Family Medicine

## 2018-03-31 DIAGNOSIS — N3941 Urge incontinence: Secondary | ICD-10-CM

## 2018-03-31 NOTE — Progress Notes (Signed)
PT[facility]  Session # 4  Health & Social Factors: no change Caffeine: 3  Alcohol: 0  Daytime voids:5 Night-time voids #per night: 2 Urgency: Storng Incontinence Episodes #per day: 2 Ankle used: right Treatment Setting: 3 Feeling/ Response: Both Comments: Patient tolerated well  Preformed By: Elberta Leatherwood, CMA   Follow Up: 1 week #5

## 2018-04-03 LAB — SPECIMEN STATUS REPORT

## 2018-04-03 LAB — ANA W/REFLEX IF POSITIVE: Anti Nuclear Antibody(ANA): NEGATIVE

## 2018-04-03 LAB — RHEUMATOID FACTOR: Rhuematoid fact SerPl-aCnc: <10 IU/mL (ref 0.0–13.9)

## 2018-04-05 ENCOUNTER — Emergency Department: Payer: Medicare Other

## 2018-04-05 ENCOUNTER — Encounter: Payer: Self-pay | Admitting: Emergency Medicine

## 2018-04-05 ENCOUNTER — Other Ambulatory Visit: Payer: Self-pay

## 2018-04-05 ENCOUNTER — Emergency Department
Admission: EM | Admit: 2018-04-05 | Discharge: 2018-04-05 | Disposition: A | Discharge status: Home / Self Care | Payer: Medicare Other | Attending: Student in an Organized Health Care Education/Training Program | Admitting: Student in an Organized Health Care Education/Training Program

## 2018-04-05 DIAGNOSIS — Y929 Unspecified place or not applicable: Secondary | ICD-10-CM | POA: Diagnosis not present

## 2018-04-05 DIAGNOSIS — W07XXXA Fall from chair, initial encounter: Secondary | ICD-10-CM | POA: Insufficient documentation

## 2018-04-05 DIAGNOSIS — S0990XA Unspecified injury of head, initial encounter: Secondary | ICD-10-CM | POA: Diagnosis not present

## 2018-04-05 DIAGNOSIS — Z7901 Long term (current) use of anticoagulants: Secondary | ICD-10-CM | POA: Insufficient documentation

## 2018-04-05 DIAGNOSIS — J45909 Unspecified asthma, uncomplicated: Secondary | ICD-10-CM | POA: Insufficient documentation

## 2018-04-05 DIAGNOSIS — Z79899 Other long term (current) drug therapy: Secondary | ICD-10-CM | POA: Diagnosis not present

## 2018-04-05 DIAGNOSIS — R0789 Other chest pain: Secondary | ICD-10-CM | POA: Diagnosis present

## 2018-04-05 DIAGNOSIS — S2241XA Multiple fractures of ribs, right side, initial encounter for closed fracture: Secondary | ICD-10-CM | POA: Diagnosis not present

## 2018-04-05 DIAGNOSIS — E119 Type 2 diabetes mellitus without complications: Secondary | ICD-10-CM | POA: Diagnosis not present

## 2018-04-05 DIAGNOSIS — Y999 Unspecified external cause status: Secondary | ICD-10-CM | POA: Insufficient documentation

## 2018-04-05 DIAGNOSIS — Y939 Activity, unspecified: Secondary | ICD-10-CM | POA: Diagnosis not present

## 2018-04-05 DIAGNOSIS — Z87891 Personal history of nicotine dependence: Secondary | ICD-10-CM | POA: Insufficient documentation

## 2018-04-05 LAB — CBC WITH DIFFERENTIAL/PLATELET
Basophils Absolute: 0.1 10*3/uL (ref 0–0.1)
Basophils Relative: 1 %
EOS ABS: 0.2 10*3/uL (ref 0–0.7)
EOS PCT: 2 %
HCT: 35.5 % (ref 35.0–47.0)
Hemoglobin: 11.6 g/dL — ABNORMAL LOW (ref 12.0–16.0)
LYMPHS ABS: 2.2 10*3/uL (ref 1.0–3.6)
Lymphocytes Relative: 23 %
MCH: 27.2 pg (ref 26.0–34.0)
MCHC: 32.7 g/dL (ref 32.0–36.0)
MCV: 83.1 fL (ref 80.0–100.0)
MONOS PCT: 6 %
Monocytes Absolute: 0.5 10*3/uL (ref 0.2–0.9)
Neutro Abs: 6.6 10*3/uL — ABNORMAL HIGH (ref 1.4–6.5)
Neutrophils Relative %: 68 %
PLATELETS: 260 10*3/uL (ref 150–440)
RBC: 4.27 MIL/uL (ref 3.80–5.20)
RDW: 17.3 % — ABNORMAL HIGH (ref 11.5–14.5)
WBC: 9.6 10*3/uL (ref 3.6–11.0)

## 2018-04-05 LAB — COMPREHENSIVE METABOLIC PANEL
ALT: 31 U/L (ref 14–54)
ANION GAP: 6 (ref 5–15)
AST: 44 U/L — ABNORMAL HIGH (ref 15–41)
Albumin: 3.2 g/dL — ABNORMAL LOW (ref 3.5–5.0)
Alkaline Phosphatase: 69 U/L (ref 38–126)
BUN: 26 mg/dL — ABNORMAL HIGH (ref 6–20)
CHLORIDE: 102 mmol/L (ref 101–111)
CO2: 31 mmol/L (ref 22–32)
Calcium: 9.7 mg/dL (ref 8.9–10.3)
Creatinine, Ser: 0.83 mg/dL (ref 0.44–1.00)
GFR calc non Af Amer: >60 mL/min (ref >60)
GLUCOSE: 136 mg/dL — AB (ref 65–99)
POTASSIUM: 4.2 mmol/L (ref 3.5–5.1)
SODIUM: 139 mmol/L (ref 135–145)
Total Bilirubin: 0.3 mg/dL (ref 0.3–1.2)
Total Protein: 8.2 g/dL — ABNORMAL HIGH (ref 6.5–8.1)

## 2018-04-05 LAB — PROTIME-INR
INR: 2.35
Prothrombin Time: 25.5 seconds — ABNORMAL HIGH (ref 11.4–15.2)

## 2018-04-05 MED ORDER — HYDROCODONE-ACETAMINOPHEN 5-325 MG PO TABS: 2.0000 | ORAL_TABLET | Freq: Four times a day (QID) | ORAL | 0 refills | Status: AC | PRN

## 2018-04-05 MED ORDER — HYDROCODONE-ACETAMINOPHEN 5-325 MG PO TABS
2.0000 | ORAL_TABLET | Freq: Once | ORAL | Status: AC
  Administered 2018-04-05: 2 via ORAL

## 2018-04-05 MED ORDER — LIDOCAINE 5 % EX PTCH: 1.0000 | MEDICATED_PATCH | Freq: Two times a day (BID) | CUTANEOUS | 0 refills | Status: DC

## 2018-04-05 MED ORDER — IPRATROPIUM-ALBUTEROL 0.5-2.5 (3) MG/3ML IN SOLN
RESPIRATORY_TRACT | Status: AC
  Filled 2018-04-05: qty 3

## 2018-04-05 MED ORDER — CYCLOBENZAPRINE HCL 5 MG PO TABS: 5.0000 mg | ORAL_TABLET | Freq: Every day | ORAL | 0 refills | Status: DC

## 2018-04-05 MED ORDER — HYDROCODONE-ACETAMINOPHEN 5-325 MG PO TABS
ORAL_TABLET | ORAL | Status: AC
  Filled 2018-04-05: qty 2

## 2018-04-05 MED ORDER — IPRATROPIUM-ALBUTEROL 0.5-2.5 (3) MG/3ML IN SOLN
3.0000 mL | Freq: Once | RESPIRATORY_TRACT | Status: AC
Start: 2018-04-05 — End: 2018-04-05
  Administered 2018-04-05: 3 mL via RESPIRATORY_TRACT

## 2018-04-05 MED ORDER — LIDOCAINE 5 % EX PTCH
1.0000 | MEDICATED_PATCH | CUTANEOUS | Status: DC
  Administered 2018-04-05: 1 via TRANSDERMAL
  Filled 2018-04-05 (×3): qty 1

## 2018-04-05 MED ORDER — LIDOCAINE 5 % EX PTCH
1.0000 | MEDICATED_PATCH | CUTANEOUS | Status: DC
  Administered 2018-04-05: 1 via TRANSDERMAL

## 2018-04-05 NOTE — ED Notes (Signed)
Pt ambulated to the bathroom to void and returned to her room without difficulty.
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
## 2018-04-07 ENCOUNTER — Ambulatory Visit: Payer: Medicare Other

## 2018-04-07 ENCOUNTER — Encounter: Payer: Self-pay | Admitting: Urology

## 2018-04-14 ENCOUNTER — Ambulatory Visit (INDEPENDENT_AMBULATORY_CARE_PROVIDER_SITE_OTHER): Payer: Medicare Other

## 2018-04-14 DIAGNOSIS — N3941 Urge incontinence: Secondary | ICD-10-CM
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
## 2018-04-21 ENCOUNTER — Ambulatory Visit: Payer: Medicare Other

## 2018-04-28 ENCOUNTER — Ambulatory Visit: Payer: Medicare Other | Admitting: Dietician

## 2018-04-28 ENCOUNTER — Ambulatory Visit (INDEPENDENT_AMBULATORY_CARE_PROVIDER_SITE_OTHER): Payer: Medicare Other | Admitting: Family Medicine

## 2018-04-28 DIAGNOSIS — N3941 Urge incontinence: Secondary | ICD-10-CM | POA: Diagnosis not present

## 2018-04-28 NOTE — Progress Notes (Signed)
PTNS  Session # 6  Health & Social Factors: no change Caffeine: 1 Alcohol: 0 Daytime voids #per day: 5 Night-time voids #per night: 1 Urgency: strong Incontinence Episodes #per day: 1 Ankle used: right Treatment Setting: 6 Feeling/ Response: both Comments: patient tolerated well.  Preformed By: Elberta Leatherwood, CMA  Follow Up: 1 week

## 2018-05-05 ENCOUNTER — Ambulatory Visit: Payer: Medicare Other

## 2018-05-07 ENCOUNTER — Telehealth: Payer: Self-pay | Admitting: Dietician

## 2018-05-07 NOTE — Telephone Encounter (Signed)
Called patient to reschedule missed appointment from 04/28/18. Left a voicemail message requesting a call back.

## 2018-05-10 DIAGNOSIS — Z6841 Body Mass Index (BMI) 40.0 and over, adult: Secondary | ICD-10-CM

## 2018-05-12 ENCOUNTER — Ambulatory Visit (INDEPENDENT_AMBULATORY_CARE_PROVIDER_SITE_OTHER): Payer: Medicare Other

## 2018-05-12 DIAGNOSIS — N3941 Urge incontinence: Secondary | ICD-10-CM | POA: Diagnosis not present

## 2018-05-12 NOTE — Progress Notes (Signed)
PTNS  Session # 7  Health & Social Factors: urge incontinence Caffeine: 0-1 Alcohol: 0 Daytime voids #per day: 4-5 Night-time voids #per night: 1 Urgency: mild Incontinence Episodes #per day: 6 Ankle used: right Treatment Setting: 9 Feeling/ Response: sensory Comments: patient states she has not been taking her myrbetriq and will start taking this again and bring voiding dairy next week to see if her symptoms improve  Preformed By: Fonnie Jarvis, CMA    Follow Up: next week

## 2018-05-19 ENCOUNTER — Ambulatory Visit (INDEPENDENT_AMBULATORY_CARE_PROVIDER_SITE_OTHER): Payer: Medicare Other

## 2018-05-19 DIAGNOSIS — N3946 Mixed incontinence: Secondary | ICD-10-CM

## 2018-05-19 NOTE — Progress Notes (Signed)
PTNS  Session # 8  Health & Social Factors: Restarted Myrbetriq Caffeine: 0-1 Alcohol: 0 Daytime voids #per day: 4-5 Night-time voids #per night: 1 Urgency: Mild Incontinence Episodes #per day: 6 Ankle used: RIGHT Treatment Setting: 6 Feeling/ Response: Sensory Comments: Pt notes no change in sx since restarting Myrbetriq  Preformed By: Sherril Cong, CMA  Follow Up: As scheduled

## 2018-05-26 ENCOUNTER — Encounter: Payer: Self-pay | Admitting: Urology

## 2018-05-26 ENCOUNTER — Ambulatory Visit: Payer: Medicare Other

## 2018-05-27 ENCOUNTER — Encounter: Payer: Self-pay | Admitting: Dietician

## 2018-05-27 NOTE — Progress Notes (Signed)
Have not heard back from patient to reschedule her missed appointment from 04/28/18. Sent letter to referring provider.

## 2018-06-02 ENCOUNTER — Ambulatory Visit: Payer: Medicare Other

## 2018-06-03 ENCOUNTER — Ambulatory Visit: Payer: Medicare Other

## 2018-06-03 ENCOUNTER — Encounter: Payer: Self-pay | Admitting: Urology
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
## 2018-06-09 ENCOUNTER — Ambulatory Visit: Payer: Medicare Other

## 2018-06-09 ENCOUNTER — Encounter: Payer: Self-pay | Admitting: Urology

## 2018-06-09 ENCOUNTER — Other Ambulatory Visit: Payer: Self-pay | Admitting: Sports Medicine

## 2018-06-09 ENCOUNTER — Ambulatory Visit (INDEPENDENT_AMBULATORY_CARE_PROVIDER_SITE_OTHER): Payer: Medicare Other | Admitting: Urology

## 2018-06-09 VITALS — BP 155/91 | HR 89 | Ht 70.0 in | Wt 350.0 lb

## 2018-06-09 DIAGNOSIS — G8929 Other chronic pain: Secondary | ICD-10-CM

## 2018-06-09 DIAGNOSIS — Z85528 Personal history of other malignant neoplasm of kidney: Secondary | ICD-10-CM

## 2018-06-09 DIAGNOSIS — M25511 Pain in right shoulder: Principal | ICD-10-CM

## 2018-06-09 DIAGNOSIS — N3946 Mixed incontinence: Secondary | ICD-10-CM | POA: Diagnosis not present

## 2018-06-09 DIAGNOSIS — Z87440 Personal history of urinary (tract) infections: Secondary | ICD-10-CM

## 2018-06-09 DIAGNOSIS — M7531 Calcific tendinitis of right shoulder: Secondary | ICD-10-CM

## 2018-06-09 DIAGNOSIS — N200 Calculus of kidney: Secondary | ICD-10-CM | POA: Diagnosis not present

## 2018-06-09 DIAGNOSIS — M19011 Primary osteoarthritis, right shoulder: Secondary | ICD-10-CM

## 2018-06-09 DIAGNOSIS — R310 Gross hematuria: Secondary | ICD-10-CM

## 2018-06-09 DIAGNOSIS — M7541 Impingement syndrome of right shoulder: Secondary | ICD-10-CM

## 2018-06-09 LAB — URINALYSIS, COMPLETE
Bilirubin, UA: NEGATIVE
Glucose, UA: NEGATIVE
Ketones, UA: NEGATIVE
Nitrite, UA: POSITIVE — AB
PH UA: 5.5 (ref 5.0–7.5)
Specific Gravity, UA: 1.02 (ref 1.005–1.030)
Urobilinogen, Ur: 0.2 mg/dL (ref 0.2–1.0)

## 2018-06-09 LAB — MICROSCOPIC EXAMINATION

## 2018-06-10 LAB — BUN+CREAT
BUN/Creatinine Ratio: 23 (ref 12–28)
BUN: 19 mg/dL (ref 8–27)
Creatinine, Ser: 0.81 mg/dL (ref 0.57–1.00)
GFR calc non Af Amer: 74 mL/min/{1.73_m2} (ref >59)
GFR, EST AFRICAN AMERICAN: 85 mL/min/{1.73_m2} (ref >59)

## 2018-06-12 LAB — CULTURE, URINE COMPREHENSIVE

## 2018-06-16 ENCOUNTER — Ambulatory Visit: Payer: Medicare Other

## 2018-07-08 ENCOUNTER — Ambulatory Visit
Admission: RE | Admit: 2018-07-08 | Discharge: 2018-07-08 | Disposition: A | Discharge status: Home / Self Care | Payer: Medicare Other | Source: Ambulatory Visit | Attending: Sports Medicine | Admitting: Sports Medicine

## 2018-07-08 ENCOUNTER — Ambulatory Visit
Admission: RE | Admit: 2018-07-08 | Discharge: 2018-07-08 | Disposition: A | Discharge status: Home / Self Care | Payer: Medicare Other | Source: Ambulatory Visit | Attending: Urology | Admitting: Urology

## 2018-07-08 DIAGNOSIS — R918 Other nonspecific abnormal finding of lung field: Secondary | ICD-10-CM | POA: Diagnosis not present

## 2018-07-08 DIAGNOSIS — I2510 Atherosclerotic heart disease of native coronary artery without angina pectoris: Secondary | ICD-10-CM | POA: Diagnosis not present

## 2018-07-08 DIAGNOSIS — I700 Atherosclerosis of aorta: Secondary | ICD-10-CM | POA: Diagnosis not present

## 2018-07-08 DIAGNOSIS — R310 Gross hematuria: Secondary | ICD-10-CM | POA: Diagnosis present

## 2018-07-08 DIAGNOSIS — M19011 Primary osteoarthritis, right shoulder: Secondary | ICD-10-CM | POA: Diagnosis not present

## 2018-07-08 DIAGNOSIS — G8929 Other chronic pain: Secondary | ICD-10-CM | POA: Diagnosis not present

## 2018-07-08 DIAGNOSIS — M542 Cervicalgia: Secondary | ICD-10-CM | POA: Diagnosis not present

## 2018-07-08 DIAGNOSIS — N200 Calculus of kidney: Secondary | ICD-10-CM

## 2018-07-08 DIAGNOSIS — M25511 Pain in right shoulder: Secondary | ICD-10-CM | POA: Diagnosis not present

## 2018-07-08 DIAGNOSIS — M7531 Calcific tendinitis of right shoulder: Secondary | ICD-10-CM | POA: Insufficient documentation

## 2018-07-08 DIAGNOSIS — Z85528 Personal history of other malignant neoplasm of kidney: Secondary | ICD-10-CM | POA: Diagnosis present

## 2018-07-08 DIAGNOSIS — Z905 Acquired absence of kidney: Secondary | ICD-10-CM | POA: Insufficient documentation

## 2018-07-08 DIAGNOSIS — K760 Fatty (change of) liver, not elsewhere classified: Secondary | ICD-10-CM | POA: Insufficient documentation

## 2018-07-08 DIAGNOSIS — N1330 Unspecified hydronephrosis: Secondary | ICD-10-CM | POA: Diagnosis not present

## 2018-07-08 DIAGNOSIS — M7541 Impingement syndrome of right shoulder: Secondary | ICD-10-CM | POA: Diagnosis not present

## 2018-07-08 HISTORY — DX: Malignant (primary) neoplasm, unspecified: C80.1

## 2018-07-08 MED ORDER — IOPAMIDOL (ISOVUE-300) INJECTION 61%
150.0000 mL | Freq: Once | INTRAVENOUS | Status: AC | PRN
  Administered 2018-07-08: 125 mL via INTRAVENOUS

## 2018-07-13 ENCOUNTER — Ambulatory Visit (INDEPENDENT_AMBULATORY_CARE_PROVIDER_SITE_OTHER): Payer: Medicare Other | Admitting: Urology

## 2018-07-13 DIAGNOSIS — N3001 Acute cystitis with hematuria: Secondary | ICD-10-CM

## 2018-07-13 DIAGNOSIS — R310 Gross hematuria: Secondary | ICD-10-CM

## 2018-07-13 LAB — MICROSCOPIC EXAMINATION

## 2018-07-13 LAB — URINALYSIS, COMPLETE
Bilirubin, UA: NEGATIVE
Glucose, UA: NEGATIVE
Ketones, UA: NEGATIVE
Nitrite, UA: POSITIVE — AB
PH UA: 6 (ref 5.0–7.5)
SPEC GRAV UA: 1.02 (ref 1.005–1.030)
Urobilinogen, Ur: 0.2 mg/dL (ref 0.2–1.0)

## 2018-07-13 NOTE — Progress Notes (Signed)
   07/13/18  Patient was scheduled today for cystoscopy for further evaluation of gross hematuria and recurrent urinary tract infections.  Her UA today is frankly positive, nitrate with leukocytes and red blood cells along with many bacteria.  She is relatively asymptomatic as on multiple previous other occasions.  Recommend deferring cystoscopy today, will treat for 7 days prior to cystoscopy in order to reduce bladder inflammation and decrease false positive.  CT urogram does have multiple findings including a large 2.7 x 1.9 x 1.4 left lower pole stone which will likely require PCNL as well as mild left hydroureteronephrosis.  This will need to be addressed at her follow-up visit.  She does need to have a cystoscopy to evaluate her bladder to ensure there is no bladder pathology prior to pursuing intervention for her stone and stricture.  She is agreeable this plan.  Hollice Espy, MD

## 2018-07-15 LAB — CULTURE, URINE COMPREHENSIVE

## 2018-07-21 ENCOUNTER — Ambulatory Visit: Payer: Medicare Other | Admitting: Urology

## 2018-07-22 ENCOUNTER — Telehealth: Payer: Self-pay | Admitting: Urology

## 2018-07-22 NOTE — Telephone Encounter (Signed)
Pt called office stating she had to postpone a procedure last week due to UTI and was told an antibiotic would be called in to treat her UTI before her rescheduled procedure, she has called the pharmacy whom states they have not received Rx from this office, please send Rx to [facility] in [place] and notify the patient @ [phone]

## 2018-07-26 NOTE — Telephone Encounter (Signed)
Urine culture is negative. She is scheduled for Cysto and will come in for that appointment.

## 2018-07-26 NOTE — Telephone Encounter (Signed)
Pt calling office again asking for her antibiotics that was supposed to be sent in from last visit, pt has upcoming Cysto and hasn't had her antibiotics to take prior to the procedure. Please advise pt at [phone]

## 2018-07-29 ENCOUNTER — Other Ambulatory Visit: Payer: Medicare Other | Admitting: Urology

## 2018-08-11 ENCOUNTER — Other Ambulatory Visit: Payer: Medicare Other | Admitting: Urology

## 2018-08-31 ENCOUNTER — Ambulatory Visit: Payer: Medicare Other | Admitting: Urology

## 2018-08-31 VITALS — BP 162/106 | HR 108 | Ht 68.0 in | Wt 335.0 lb

## 2018-08-31 DIAGNOSIS — R310 Gross hematuria: Secondary | ICD-10-CM

## 2018-08-31 LAB — MICROSCOPIC EXAMINATION

## 2018-08-31 LAB — URINALYSIS, COMPLETE
Bilirubin, UA: NEGATIVE
GLUCOSE, UA: NEGATIVE
Ketones, UA: NEGATIVE
NITRITE UA: POSITIVE — AB
PH UA: 7 (ref 5.0–7.5)
Specific Gravity, UA: 1.02 (ref 1.005–1.030)
Urobilinogen, Ur: 0.2 mg/dL (ref 0.2–1.0)

## 2018-08-31 MED ORDER — CIPROFLOXACIN HCL 500 MG PO TABS: 500.0000 mg | ORAL_TABLET | Freq: Two times a day (BID) | ORAL | 0 refills | Status: DC

## 2018-09-09 ENCOUNTER — Ambulatory Visit (INDEPENDENT_AMBULATORY_CARE_PROVIDER_SITE_OTHER): Payer: Medicare Other | Admitting: Urology

## 2018-09-09 ENCOUNTER — Other Ambulatory Visit: Payer: Medicare Other | Admitting: Urology

## 2018-09-09 ENCOUNTER — Encounter: Payer: Self-pay | Admitting: Urology

## 2018-09-09 VITALS — BP 162/74 | HR 105 | Ht 68.0 in | Wt 330.0 lb

## 2018-09-09 DIAGNOSIS — N1330 Unspecified hydronephrosis: Secondary | ICD-10-CM | POA: Diagnosis not present

## 2018-09-09 DIAGNOSIS — R911 Solitary pulmonary nodule: Secondary | ICD-10-CM

## 2018-09-09 DIAGNOSIS — N200 Calculus of kidney: Secondary | ICD-10-CM | POA: Diagnosis not present

## 2018-09-09 DIAGNOSIS — R310 Gross hematuria: Secondary | ICD-10-CM

## 2018-09-09 DIAGNOSIS — Z85528 Personal history of other malignant neoplasm of kidney: Secondary | ICD-10-CM | POA: Diagnosis not present

## 2018-09-09 LAB — URINALYSIS, COMPLETE
BILIRUBIN UA: NEGATIVE
GLUCOSE, UA: NEGATIVE
KETONES UA: NEGATIVE
Nitrite, UA: POSITIVE — AB
UUROB: 0.2 mg/dL (ref 0.2–1.0)
pH, UA: 5.5 (ref 5.0–7.5)

## 2018-09-09 LAB — MICROSCOPIC EXAMINATION
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
## 2018-09-10 ENCOUNTER — Telehealth: Payer: Self-pay | Admitting: Radiology

## 2018-09-10 NOTE — Telephone Encounter (Signed)
LMOM to return call. Need to schedule surgery discussed with Dr Erlene Quan on 09/09/2018.

## 2018-09-12 LAB — CULTURE, URINE COMPREHENSIVE

## 2018-09-13 NOTE — Telephone Encounter (Signed)
LMOM to return call on both cell & home numbers.

## 2018-09-15 ENCOUNTER — Other Ambulatory Visit: Payer: Self-pay | Admitting: Radiology

## 2018-09-15 DIAGNOSIS — N200 Calculus of kidney: Secondary | ICD-10-CM

## 2018-09-15 DIAGNOSIS — N1330 Unspecified hydronephrosis: Secondary | ICD-10-CM

## 2018-09-15 NOTE — Telephone Encounter (Signed)
Teresa Espy, MD  [name], RN        [name], when you talk to her, can you let her know I ordered a chest Ct scan for in 1 year to f/u pulmonary nodues? I do not suspect pathology.     Also notified patient of Dr Cherrie Gauze note above.

## 2018-09-15 NOTE — Telephone Encounter (Signed)
Surgery scheduled on 09/21/2018 with Dr Bernardo Heater. Patient aware. Advised patient that Dr Lovie Macadamia will contact her to arrange Lovenox bridge prior to surgery. Questions answered. Patient voices understanding.

## 2018-09-16 ENCOUNTER — Telehealth: Payer: Self-pay | Admitting: Radiology

## 2018-09-16 NOTE — Telephone Encounter (Signed)
Notified patient of surgery date change to 09/28/2018 & pre-admit testing appointment 09/23/2018 @8 :15. Explained that Dr Reuel Boom office has been notified of date change & he will reach her regarding instructions for Lovenox bridge. Questions were answered & patient expresses understanding of these instructions.

## 2018-09-20 ENCOUNTER — Inpatient Hospital Stay: Admission: RE | Admit: 2018-09-20 | Payer: Medicare Other | Source: Ambulatory Visit

## 2018-09-23 ENCOUNTER — Encounter
Admission: RE | Admit: 2018-09-23 | Discharge: 2018-09-23 | Disposition: A | Discharge status: Home / Self Care | Payer: Medicare Other | Source: Ambulatory Visit | Attending: Urology | Admitting: Urology

## 2018-09-23 ENCOUNTER — Other Ambulatory Visit: Payer: Self-pay

## 2018-09-23 DIAGNOSIS — E119 Type 2 diabetes mellitus without complications: Secondary | ICD-10-CM | POA: Insufficient documentation

## 2018-09-23 DIAGNOSIS — Z01818 Encounter for other preprocedural examination: Secondary | ICD-10-CM | POA: Insufficient documentation

## 2018-09-23 HISTORY — DX: Essential (primary) hypertension: I10

## 2018-09-23 HISTORY — DX: Headache, unspecified: R51.9

## 2018-09-23 HISTORY — DX: Methicillin resistant Staphylococcus aureus infection, unspecified site: A49.02

## 2018-09-23 HISTORY — DX: Adverse effect of unspecified anesthetic, initial encounter: T41.45XA

## 2018-09-23 HISTORY — DX: Personal history of urinary calculi: Z87.442

## 2018-09-23 HISTORY — DX: Headache: R51

## 2018-09-23 HISTORY — DX: Gastro-esophageal reflux disease without esophagitis: K21.9

## 2018-09-23 LAB — BASIC METABOLIC PANEL
Anion gap: 7 (ref 5–15)
BUN: 16 mg/dL (ref 8–23)
CALCIUM: 9 mg/dL (ref 8.9–10.3)
CHLORIDE: 103 mmol/L (ref 98–111)
CO2: 32 mmol/L (ref 22–32)
CREATININE: 0.77 mg/dL (ref 0.44–1.00)
GFR calc Af Amer: >60 mL/min (ref >60)
GFR calc non Af Amer: >60 mL/min (ref >60)
GLUCOSE: 188 mg/dL — AB (ref 70–99)
Potassium: 3.9 mmol/L (ref 3.5–5.1)
Sodium: 142 mmol/L (ref 135–145)

## 2018-09-23 LAB — CBC
HEMATOCRIT: 37.3 % (ref 36.0–46.0)
HEMOGLOBIN: 11.7 g/dL — AB (ref 12.0–15.0)
MCH: 28.8 pg (ref 26.0–34.0)
MCHC: 31.4 g/dL (ref 30.0–36.0)
MCV: 91.9 fL (ref 80.0–100.0)
Platelets: 264 10*3/uL (ref 150–400)
RBC: 4.06 MIL/uL (ref 3.87–5.11)
RDW: 14.4 % (ref 11.5–15.5)
WBC: 6.9 10*3/uL (ref 4.0–10.5)
nRBC: 0 % (ref 0.0–0.2)

## 2018-09-23 NOTE — Patient Instructions (Signed)
Your procedure is scheduled on: Tuesday 09/28/18.  Report to DAY SURGERY DEPARTMENT LOCATED ON 2ND FLOOR MEDICAL MALL ENTRANCE. To find out your arrival time please call [phone] between 1PM - 3PM on Monday 09/27/18.  Remember: Instructions that are not followed completely may result in serious medical risk, up to and including death, or upon the discretion of your surgeon and anesthesiologist your surgery may need to be rescheduled.     _X__ 1. Do not eat food after midnight the night before your procedure.                 No gum chewing or hard candies. You may drink SUGAR FREE clear liquids up to 2 hours                 before you are scheduled to arrive for your surgery- DO NOT drink clear                 liquids within 2 hours of the start of your surgery.                 __X__2.  On the morning of surgery brush your teeth with toothpaste and water, you may rinse your mouth with mouthwash if you wish.  Do not swallow any toothpaste or mouthwash.     _X__ 3.  No Alcohol for 24 hours before or after surgery.   _X__ 4.  Do Not Smoke or use e-cigarettes For 24 Hours Prior to Your Surgery.                 Do not use any chewable tobacco products for at least 6 hours prior to                 surgery..   __X__  6.  Notify your doctor if there is any change in your medical condition      (cold, fever, infections).      Do not wear jewelry, make-up, hairpins, clips or nail polish. Do not wear lotions, powders, or perfumes.  Do not shave 48 hours prior to surgery. Men may shave face and neck. Do not bring valuables to the hospital.    [facility] is not responsible for any belongings or valuables.  Contacts, dentures/partials or body piercings may not be worn into surgery. Bring a case for your contacts, glasses or hearing aids, a denture cup will be supplied.    Patients discharged the day of surgery will not be allowed to drive home.   Please read over the following fact sheets  that you were given:   MRSA Information  __X__ Take these medicines the morning of surgery with A SIP OF WATER:     1. albuterol (PROAIR HFA) 108 (90 Base) MCG/ACT inhaler  2. budesonide-formoterol (SYMBICORT) 80-4.5 MCG/ACT inhaler  3. FLUoxetine (PROZAC) 40 MG capsule  4. gabapentin (NEURONTIN) 600 MG tablet  5. mirabegron ER (MYRBETRIQ) 50 MG TB24 tablet  6. venlafaxine XR (EFFEXOR-XR) 37.5 MG 24 hr capsule     _ X___ Use inhalers on the day of surgery. Also bring the inhaler with you to the hospital on the morning of surgery.  __X__ Your last dose of Metformin will be on Saturday 09/25/18. (Stop 2 days prior to your procedure)    __X__ Take 1/2 of usual insulin dose the night before surgery. No insulin the morning          of surgery.   __X__ Stop Blood Thinners Coumadin/Plavix/Xarelto/Pleta/Pradaxa/Eliquis/Effient/Aspirin  PLEASE CALL DR. Lovie Macadamia FOR INSTRUCTIONS ON STOPPING COUMADIN AND TRANSITIONING TO LOVENOX  __X__ Stop Anti-inflammatories 7 days before surgery such as Advil, Ibuprofen, Motrin, BC or Goodies Powder, Naprosyn, Naproxen, Aleve, Aspirin, Meloxicam. May take Tylenol if needed for pain or discomfort.    __X__ Bring C-Pap to the hospital.

## 2018-09-27 ENCOUNTER — Telehealth: Payer: Self-pay | Admitting: Radiology

## 2018-09-27 NOTE — Telephone Encounter (Signed)
Patient called stating she forgot to stop warfarin & start Lovenox injections prior to surgery scheduled 09/28/2018. Offered patient surgery on 10/12/2018. Patient declined stating she wants to postpone until after Thanksgiving. Surgery rescheduled to 10/19/2018. Instructed patient to follow instructions given by Dr Lovie Macadamia regarding warfarin & lovenox & to follow instructions given by pre-admission testing. Questions answered. Patient expresses understanding of conversation.

## 2018-10-20 NOTE — Telephone Encounter (Signed)
Patient called Same Day Surgery stating she forgot to stop warfarin & start Lovenox injections prior to surgery scheduled 10/19/2018.  LMOM for patient to return call to reschedule surgery.

## 2018-10-21 ENCOUNTER — Other Ambulatory Visit: Payer: Self-pay | Admitting: Radiology

## 2018-10-21 DIAGNOSIS — N1330 Unspecified hydronephrosis: Secondary | ICD-10-CM

## 2018-10-21 DIAGNOSIS — N200 Calculus of kidney: Secondary | ICD-10-CM

## 2018-10-21 NOTE — Telephone Encounter (Signed)
Spoke with patient about rescheduling surgery to 11/02/2018. Advised patient that a urine culture will need to be repeated prior to surgery. Instructions given to patient by pre-admission testing office have been printed & will be given to patient when she returns to clinic for urine culture and also mailed to address on file. Questions answered. Patient expresses understanding of conversation.

## 2018-10-22 ENCOUNTER — Other Ambulatory Visit: Payer: Medicare Other

## 2018-10-22 DIAGNOSIS — N1330 Unspecified hydronephrosis: Secondary | ICD-10-CM

## 2018-10-22 DIAGNOSIS — N200 Calculus of kidney: Secondary | ICD-10-CM

## 2018-10-22 LAB — URINALYSIS, COMPLETE
Bilirubin, UA: NEGATIVE
Glucose, UA: NEGATIVE
Ketones, UA: NEGATIVE
Nitrite, UA: NEGATIVE
Specific Gravity, UA: 1.015 (ref 1.005–1.030)
Urobilinogen, Ur: 0.2 mg/dL (ref 0.2–1.0)
pH, UA: 5.5 (ref 5.0–7.5)

## 2018-10-22 LAB — MICROSCOPIC EXAMINATION

## 2018-10-26 LAB — CULTURE, URINE COMPREHENSIVE

## 2018-10-29 ENCOUNTER — Encounter
Admission: RE | Admit: 2018-10-29 | Discharge: 2018-10-29 | Disposition: A | Discharge status: Home / Self Care | Payer: Medicare Other | Source: Ambulatory Visit | Attending: Urology | Admitting: Urology

## 2018-10-29 NOTE — Patient Instructions (Addendum)
Your procedure is scheduled on Tuesday 11/02/18.  Report to the [facility] ON THE 2ND FLOOR OF THE MEDICAL MALL ENTRANCE.   To find out your arrival time, please call [phone] between 1pm and 3pm on Monday 11/01/18.  Remember: Instructions that are not followed completely may result in serious medical risk up to and including death, or upon the discretion of your surgeon and the anesthesiologist your surgery may need to be rescheduled.  __X__Do not eat food after midnight the night before your surgery including hard candy & don't chew gum after midnight. You may have SUGAR FREE CLEAR LIQUIDS up until 2 hours before your scheduled arrival time. DO NOT consume anything in the 2 hour window before your scheduled arrival time.   __X__On the morning of your surgery, brush your teeth or clean your mouth with toothpaste and water. You may use mouthwash if you wish. Don't swallow either one.  __X__ No alcohol for 24 hours before or after your procedure.  __X__ No smoking for 24 hours prior to your procedure.  __X__ Notify your surgeon if there is any change to your medical condition such as cold/upper respiratory infection, fever, infections.   Do NOT wear jewelry, makeup, hairpins, clips, or nail polish. Do NOT wear lotions, creams, powders, perfumes. Do NOT shave for 48 hours prior to your procedure. Do NOT bring valuables to the hospital.  [facility] is not responsible for any belongings or valuables.  Contact lenses, dentures/partials, body piercings must be removed before entering the operation room.  Patients discharged the day of surgery will not be allowed to drive home.  A competent adult must stay with you for 24 hours after your surgery.   TAKE THESE MEDICATIONS WITH A SIP OF WATER ON THE MORNING OF YOUR SURGERY:   1. Albuterol  2. Symbicort  3. Prozac   4. Neurontin  5. Effexor  6. Oxycodone if needed   __X__ Use your inhaler on the morning of  your surgery and bring it with you to the hospital.  __X__ Your last dose of Metformin will be on Saturday 10/30/18. (Stop 2 days prior to your surgery)  __X__ Take 1/2 your usual insulin dose the night before your surgery. NO INSULIN THE MORNING OF YOUR SURGERY.  __X__ Follow Dr. Reuel Boom instructions for Coumadin/Lovenox: Continue to administer Lovenox daily and hold Warfarin. First Lovenox injection was on 10/28/18.  __X__ Stop ALL anti-inflammatories TODAY such as Advil/Motrin/Ibuprofen, BC or Goodies Powder, Naprosyn, Naproxen/Aleve, Aspirin, Meloxicam, etc. You may take Tylenol if needed for pain.  __X__ Bring your C-Pap to the hospital.  Your procedure is scheduled on: Wednesday 06/16/18.  Report to DAY SURGERY DEPARTMENT LOCATED ON 2ND FLOOR MEDICAL MALL ENTRANCE. To find out your arrival time please call [phone] between 1PM - 3PM on Tuesday 06/15/18.  Remember: Instructions that are not followed completely may result in serious medical risk, up to and including death, or upon the discretion of your surgeon and anesthesiologist your surgery may need to be rescheduled.     _X__ 1. Do not eat food after midnight the night before your procedure.                 No gum chewing or hard candies. You may drink clear liquids up to 2 hours                 before you are scheduled to arrive for your surgery- DO not drink clear  liquids within 2 hours of the start of your surgery.                 Clear Liquids include:  water, apple juice without pulp, clear carbohydrate                 drink such as Clearfast or Gatorade, Black Coffee or Tea (Do not add                 anything to coffee or tea).  __X__2.  On the morning of surgery brush your teeth with toothpaste and water, you                 may rinse your mouth with mouthwash if you wish.  Do not swallow any              toothpaste of mouthwash.     _X__ 3.  No Alcohol for 24 hours before or after surgery.    _X__ 4.  Do Not Smoke or use e-cigarettes For 24 Hours Prior to Your Surgery.                 Do not use any chewable tobacco products for at least 6 hours prior to                 surgery.  ____  5.  Bring all medications with you on the day of surgery if instructed.   __X__  6.  Notify your doctor if there is any change in your medical condition      (cold, fever, infections).     Do not wear jewelry, make-up, hairpins, clips or nail polish. Do not wear lotions, powders, or perfumes.  Do not shave 48 hours prior to surgery. Men may shave face and neck. Do not bring valuables to the hospital.    [facility] is not responsible for any belongings or valuables.  Contacts, dentures/partials or body piercings may not be worn into surgery. Bring a case for your contacts, glasses or hearing aids, a denture cup will be supplied. Leave your suitcase in the car. After surgery it may be brought to your room. For patients admitted to the hospital, discharge time is determined by your treatment team.   Patients discharged the day of surgery will not be allowed to drive home.   Please read over the following fact sheets that you were given:   MRSA Information  __X__ Take these medicines the morning of surgery with A SIP OF WATER:     1. albuterol (PROVENTIL HFA;VENTOLIN HFA) 108 (90 Base) MCG/ACT inhaler  2. amLODipine (NORVASC) 5 MG tablet  3. fenofibrate 54 MG tablet  4.omeprazole (PRILOSEC) 20 MG capsule  5.  6.  ____ Fleet Enema (as directed)   __X__ Use CHG Soap/SAGE wipes as directed  _ X___ Use inhalers on the day of surgery. Also bring the inhaler with you to the hospital on the morning of surgery.  __X__ Stop metformin/Janumet/Farxiga 2 days prior to surgery    ____ Take 1/2 of usual insulin dose the night before surgery. No insulin the morning          of surgery.   __X__ Stop Blood Thinners Coumadin/Plavix/Xarelto/Pleta/Pradaxa/Eliquis/Effient/Aspirin Wednesday  06/09/18.  __X__ Stop Anti-inflammatories 7 days before surgery such as Advil, Ibuprofen, Motrin, BC or Goodies Powder, Naprosyn, Naproxen, Aleve, Aspirin, Meloxicam. May take Tylenol if needed for pain or discomfort.   __X__ Stop all herbal supplements, fish oil or vitamin E  until after surgery. Wednesday 06/09/18.   ____ Bring C-Pap to the hospital.     Your procedure is scheduled on: Wednesday 06/16/18.  Report to DAY SURGERY DEPARTMENT LOCATED ON 2ND FLOOR MEDICAL MALL ENTRANCE. To find out your arrival time please call [phone] between 1PM - 3PM on Tuesday 06/15/18.  Remember: Instructions that are not followed completely may result in serious medical risk, up to and including death, or upon the discretion of your surgeon and anesthesiologist your surgery may need to be rescheduled.     _X__ 1. Do not eat food after midnight the night before your procedure.                 No gum chewing or hard candies. You may drink clear liquids up to 2 hours                 before you are scheduled to arrive for your surgery- DO not drink clear                 liquids within 2 hours of the start of your surgery.                 Clear Liquids include:  water, apple juice without pulp, clear carbohydrate                 drink such as Clearfast or Gatorade, Black Coffee or Tea (Do not add                 anything to coffee or tea).  __X__2.  On the morning of surgery brush your teeth with toothpaste and water, you                 may rinse your mouth with mouthwash if you wish.  Do not swallow any              toothpaste of mouthwash.     _X__ 3.  No Alcohol for 24 hours before or after surgery.   _X__ 4.  Do Not Smoke or use e-cigarettes For 24 Hours Prior to Your Surgery.                 Do not use any chewable tobacco products for at least 6 hours prior to                 surgery.  ____  5.  Bring all medications with you on the day of surgery if instructed.   __X__  6.  Notify your  doctor if there is any change in your medical condition      (cold, fever, infections).     Do not wear jewelry, make-up, hairpins, clips or nail polish. Do not wear lotions, powders, or perfumes.  Do not shave 48 hours prior to surgery. Men may shave face and neck. Do not bring valuables to the hospital.    [facility] is not responsible for any belongings or valuables.  Contacts, dentures/partials or body piercings may not be worn into surgery. Bring a case for your contacts, glasses or hearing aids, a denture cup will be supplied. Leave your suitcase in the car. After surgery it may be brought to your room. For patients admitted to the hospital, discharge time is determined by your treatment team.   Patients discharged the day of surgery will not be allowed to drive home.   Please read over the following fact sheets that you were given:   MRSA  Information  __X__ Take these medicines the morning of surgery with A SIP OF WATER:     1. albuterol (PROVENTIL HFA;VENTOLIN HFA) 108 (90 Base) MCG/ACT inhaler  2. amLODipine (NORVASC) 5 MG tablet  3. fenofibrate 54 MG tablet  4.omeprazole (PRILOSEC) 20 MG capsule  5.  6.  ____ Fleet Enema (as directed)   __X__ Use CHG Soap/SAGE wipes as directed  _ X___ Use inhalers on the day of surgery. Also bring the inhaler with you to the hospital on the morning of surgery.  __X__ Stop metformin/Janumet/Farxiga 2 days prior to surgery    ____ Take 1/2 of usual insulin dose the night before surgery. No insulin the morning          of surgery.   __X__ Stop Blood Thinners Coumadin/Plavix/Xarelto/Pleta/Pradaxa/Eliquis/Effient/Aspirin Wednesday 06/09/18.  __X__ Stop Anti-inflammatories 7 days before surgery such as Advil, Ibuprofen, Motrin, BC or Goodies Powder, Naprosyn, Naproxen, Aleve, Aspirin, Meloxicam. May take Tylenol if needed for pain or discomfort.   __X__ Stop all herbal supplements, fish oil or vitamin E until after surgery. Wednesday  06/09/18.   ____ Bring C-Pap to the hospital.

## 2018-11-01 MED ORDER — SODIUM CHLORIDE 0.9 % IV SOLN
1.0000 g | INTRAVENOUS | Status: AC
  Administered 2018-11-02: 1 g via INTRAVENOUS
  Filled 2018-11-01: qty 1000

## 2018-11-02 ENCOUNTER — Ambulatory Visit: Payer: Medicare Other | Admitting: Certified Registered Nurse Anesthetist

## 2018-11-02 ENCOUNTER — Ambulatory Visit
Admission: RE | Admit: 2018-11-02 | Discharge: 2018-11-02 | Disposition: A | Discharge status: Home / Self Care | Payer: Medicare Other | Source: Ambulatory Visit | Attending: Urology | Admitting: Urology

## 2018-11-02 ENCOUNTER — Encounter: Payer: Self-pay | Admitting: Certified Registered Nurse Anesthetist

## 2018-11-02 ENCOUNTER — Encounter
Admission: RE | Disposition: A | Discharge status: Home / Self Care | Payer: Self-pay | Source: Ambulatory Visit | Attending: Urology

## 2018-11-02 ENCOUNTER — Telehealth: Payer: Self-pay | Admitting: Urology

## 2018-11-02 DIAGNOSIS — Z905 Acquired absence of kidney: Secondary | ICD-10-CM | POA: Diagnosis not present

## 2018-11-02 DIAGNOSIS — G4730 Sleep apnea, unspecified: Secondary | ICD-10-CM | POA: Insufficient documentation

## 2018-11-02 DIAGNOSIS — J449 Chronic obstructive pulmonary disease, unspecified: Secondary | ICD-10-CM | POA: Insufficient documentation

## 2018-11-02 DIAGNOSIS — M1990 Unspecified osteoarthritis, unspecified site: Secondary | ICD-10-CM | POA: Insufficient documentation

## 2018-11-02 DIAGNOSIS — Z86718 Personal history of other venous thrombosis and embolism: Secondary | ICD-10-CM | POA: Diagnosis not present

## 2018-11-02 DIAGNOSIS — Z87442 Personal history of urinary calculi: Secondary | ICD-10-CM | POA: Insufficient documentation

## 2018-11-02 DIAGNOSIS — Z794 Long term (current) use of insulin: Secondary | ICD-10-CM | POA: Diagnosis not present

## 2018-11-02 DIAGNOSIS — N2886 Ureteritis cystica: Secondary | ICD-10-CM | POA: Diagnosis not present

## 2018-11-02 DIAGNOSIS — Z87891 Personal history of nicotine dependence: Secondary | ICD-10-CM | POA: Insufficient documentation

## 2018-11-02 DIAGNOSIS — N132 Hydronephrosis with renal and ureteral calculous obstruction: Secondary | ICD-10-CM | POA: Insufficient documentation

## 2018-11-02 DIAGNOSIS — N1330 Unspecified hydronephrosis: Secondary | ICD-10-CM | POA: Diagnosis not present

## 2018-11-02 DIAGNOSIS — Z7901 Long term (current) use of anticoagulants: Secondary | ICD-10-CM | POA: Diagnosis not present

## 2018-11-02 DIAGNOSIS — E785 Hyperlipidemia, unspecified: Secondary | ICD-10-CM | POA: Insufficient documentation

## 2018-11-02 DIAGNOSIS — Z7951 Long term (current) use of inhaled steroids: Secondary | ICD-10-CM | POA: Insufficient documentation

## 2018-11-02 DIAGNOSIS — Z79899 Other long term (current) drug therapy: Secondary | ICD-10-CM | POA: Diagnosis not present

## 2018-11-02 DIAGNOSIS — E119 Type 2 diabetes mellitus without complications: Secondary | ICD-10-CM | POA: Diagnosis not present

## 2018-11-02 DIAGNOSIS — F329 Major depressive disorder, single episode, unspecified: Secondary | ICD-10-CM | POA: Insufficient documentation

## 2018-11-02 DIAGNOSIS — N200 Calculus of kidney: Secondary | ICD-10-CM

## 2018-11-02 DIAGNOSIS — J45909 Unspecified asthma, uncomplicated: Secondary | ICD-10-CM | POA: Insufficient documentation

## 2018-11-02 DIAGNOSIS — Z885 Allergy status to narcotic agent status: Secondary | ICD-10-CM | POA: Diagnosis not present

## 2018-11-02 DIAGNOSIS — Z85528 Personal history of other malignant neoplasm of kidney: Secondary | ICD-10-CM | POA: Diagnosis not present

## 2018-11-02 DIAGNOSIS — Z882 Allergy status to sulfonamides status: Secondary | ICD-10-CM | POA: Diagnosis not present

## 2018-11-02 HISTORY — PX: CYSTOSCOPY/RETROGRADE/URETEROSCOPY: SHX5316

## 2018-11-02 LAB — GLUCOSE, CAPILLARY
Glucose-Capillary: 183 mg/dL — ABNORMAL HIGH (ref 70–99)
Glucose-Capillary: 179 mg/dL — ABNORMAL HIGH (ref 70–99)

## 2018-11-02 SURGERY — CYSTOSCOPY/RETROGRADE/URETEROSCOPY
Anesthesia: General | Laterality: Left

## 2018-11-02 MED ORDER — ONDANSETRON HCL 4 MG/2ML IJ SOLN
INTRAMUSCULAR | Status: AC
  Filled 2018-11-02: qty 2

## 2018-11-02 MED ORDER — GENTAMICIN SULFATE 40 MG/ML IJ SOLN
5.0000 mg/kg | Freq: Once | INTRAVENOUS | Status: AC
  Administered 2018-11-02: 510 mg via INTRAVENOUS
  Filled 2018-11-02: qty 12.75

## 2018-11-02 MED ORDER — PROPOFOL 10 MG/ML IV BOLUS
INTRAVENOUS | Status: DC | PRN
  Administered 2018-11-02: 200 mg via INTRAVENOUS

## 2018-11-02 MED ORDER — MIDAZOLAM HCL 2 MG/2ML IJ SOLN
INTRAMUSCULAR | Status: DC | PRN
  Administered 2018-11-02: 2 mg via INTRAVENOUS

## 2018-11-02 MED ORDER — LIDOCAINE HCL (PF) 2 % IJ SOLN
INTRAMUSCULAR | Status: AC
  Filled 2018-11-02: qty 10

## 2018-11-02 MED ORDER — FENTANYL CITRATE (PF) 100 MCG/2ML IJ SOLN
INTRAMUSCULAR | Status: DC | PRN
  Administered 2018-11-02: 100 ug via INTRAVENOUS

## 2018-11-02 MED ORDER — IPRATROPIUM-ALBUTEROL 0.5-2.5 (3) MG/3ML IN SOLN
RESPIRATORY_TRACT | Status: AC
  Filled 2018-11-02: qty 3

## 2018-11-02 MED ORDER — IPRATROPIUM-ALBUTEROL 0.5-2.5 (3) MG/3ML IN SOLN
3.0000 mL | Freq: Once | RESPIRATORY_TRACT | Status: AC
  Administered 2018-11-02: 3 mL via RESPIRATORY_TRACT

## 2018-11-02 MED ORDER — SUGAMMADEX SODIUM 500 MG/5ML IV SOLN
INTRAVENOUS | Status: DC | PRN
  Administered 2018-11-02: 320 mg via INTRAVENOUS

## 2018-11-02 MED ORDER — ONDANSETRON HCL 4 MG/2ML IJ SOLN
INTRAMUSCULAR | Status: DC | PRN
  Administered 2018-11-02: 4 mg via INTRAVENOUS

## 2018-11-02 MED ORDER — DEXAMETHASONE SODIUM PHOSPHATE 10 MG/ML IJ SOLN
INTRAMUSCULAR | Status: AC
  Filled 2018-11-02: qty 1

## 2018-11-02 MED ORDER — LIDOCAINE HCL (CARDIAC) PF 100 MG/5ML IV SOSY
PREFILLED_SYRINGE | INTRAVENOUS | Status: DC | PRN
  Administered 2018-11-02: 100 mg via INTRAVENOUS

## 2018-11-02 MED ORDER — PROPOFOL 10 MG/ML IV BOLUS
INTRAVENOUS | Status: AC
  Filled 2018-11-02: qty 20

## 2018-11-02 MED ORDER — FAMOTIDINE 20 MG PO TABS: 20.0000 mg | ORAL_TABLET | Freq: Once | ORAL | Status: DC

## 2018-11-02 MED ORDER — ROCURONIUM BROMIDE 50 MG/5ML IV SOLN
INTRAVENOUS | Status: AC
  Filled 2018-11-02: qty 1

## 2018-11-02 MED ORDER — FENTANYL CITRATE (PF) 100 MCG/2ML IJ SOLN
INTRAMUSCULAR | Status: AC
  Filled 2018-11-02: qty 2

## 2018-11-02 MED ORDER — FENTANYL CITRATE (PF) 100 MCG/2ML IJ SOLN: 25.0000 ug | INTRAMUSCULAR | Status: DC | PRN

## 2018-11-02 MED ORDER — DEXAMETHASONE SODIUM PHOSPHATE 10 MG/ML IJ SOLN
INTRAMUSCULAR | Status: DC | PRN
  Administered 2018-11-02: 4 mg via INTRAVENOUS

## 2018-11-02 MED ORDER — IOPAMIDOL (ISOVUE-200) INJECTION 41%
INTRAVENOUS | Status: DC | PRN
  Administered 2018-11-02: 33 mL

## 2018-11-02 MED ORDER — MIDAZOLAM HCL 2 MG/2ML IJ SOLN
INTRAMUSCULAR | Status: AC
  Filled 2018-11-02: qty 2

## 2018-11-02 MED ORDER — ROCURONIUM BROMIDE 100 MG/10ML IV SOLN
INTRAVENOUS | Status: DC | PRN
  Administered 2018-11-02: 50 mg via INTRAVENOUS

## 2018-11-02 MED ORDER — SODIUM CHLORIDE 0.9 % IV SOLN
INTRAVENOUS | Status: DC
  Administered 2018-11-02: 11:00:00 via INTRAVENOUS

## 2018-11-02 SURGICAL SUPPLY — 17 items
BAG DRAIN CYSTO-URO LG1000N (MISCELLANEOUS) ×4 IMPLANT
BRUSH SCRUB EZ  4% CHG (MISCELLANEOUS)
BRUSH SCRUB EZ 4% CHG (MISCELLANEOUS) IMPLANT
CATH URETL 5X70 OPEN END (CATHETERS) ×4 IMPLANT
GLOVE BIO SURGEON STRL SZ8 (GLOVE) ×4 IMPLANT
GOWN STANDARD XL  REUSABL (MISCELLANEOUS) ×4 IMPLANT
KIT TURNOVER CYSTO (KITS) ×4 IMPLANT
PACK CYSTO AR (MISCELLANEOUS) ×4 IMPLANT
SENSORWIRE 0.038 NOT ANGLED (WIRE) ×4
SET CYSTO W/LG BORE CLAMP LF (SET/KITS/TRAYS/PACK) ×4 IMPLANT
SOL .9 NS 3000ML IRR  AL (IV SOLUTION) ×2
SOL .9 NS 3000ML IRR UROMATIC (IV SOLUTION) ×2 IMPLANT
STENT URET 6FRX24 CONTOUR (STENTS) IMPLANT
STENT URET 6FRX26 CONTOUR (STENTS) IMPLANT
SURGILUBE 2OZ TUBE FLIPTOP (MISCELLANEOUS) ×4 IMPLANT
WATER STERILE IRR 1000ML POUR (IV SOLUTION) ×4 IMPLANT
WIRE SENSOR 0.038 NOT ANGLED (WIRE) ×2 IMPLANT
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
## 2018-11-02 NOTE — Transfer of Care (Signed)
Immediate Anesthesia Transfer of Care Note  Patient: Teresa Carr  Procedure(s) Performed: CYSTOSCOPY/RETROGRADE/URETEROSCOPY (Bilateral )  Patient Location: PACU  Anesthesia Type:General  Level of Consciousness: awake  Airway & Oxygen Therapy: Patient connected to face mask oxygen  Post-op Assessment: Post -op Vital signs reviewed and stable  Post vital signs: stable  Last Vitals:  Vitals Value Taken Time  BP 129/77 11/02/2018 12:47 PM  Temp    Pulse 98 11/02/2018 12:47 PM  Resp 16 11/02/2018 12:47 PM  SpO2 95 % 11/02/2018 12:47 PM    Last Pain:  Vitals:   11/02/18 1024  TempSrc: Oral         Complications: No apparent anesthesia complications

## 2018-11-02 NOTE — Interval H&P Note (Signed)
History and Physical Interval Note:  11/02/2018 11:29 AM  Teresa Carr  has presented today for surgery, with the diagnosis of left hydronephrosis, renal stones  The various methods of treatment have been discussed with the patient and family. After consideration of risks, benefits and other options for treatment, the patient has consented to  Procedure(s) with comments: CYSTOSCOPY/RETROGRADE/URETEROSCOPY (Bilateral) Ureteral BALLOON DILATION (Left) - ureteral balloon dilation CYSTOSCOPY WITH STENT PLACEMENT (Left) CYSTOSCOPY WITH HOLMIUM LASER LITHOTRIPSY (Left) as a surgical intervention .  The patient's history has been reviewed, patient examined, no change in status, stable for surgery.  I have reviewed the patient's chart and labs.  Questions were answered to the patient's satisfaction.     [place]

## 2018-11-02 NOTE — Op Note (Signed)
Preoperative diagnosis: Left hydronephrosis  Postoperative diagnosis: Left hydronephrosis; no evidence of obstruction  Procedure:  1. Cystoscopy 2. Left ureteroscopy-diagnostic 3. Bilateral retrograde pyelography with interpretation   Surgeon: Nicki Reaper C. Florentino Laabs, M.D.  Anesthesia: General  Complications: None  Intraoperative findings:  1.  Right retrograde pyelography demonstrated a normal caliber ureter without dilation or filling defects.  No right hydronephrosis.  Filling defect lower pole calyx consistent with her known calculus  2.  Left retrograde pyelography shows a normal caliber distal mid and lower proximal ureter.  There is tapering and fullness of the upper proximal ureter and mild dilation of the collecting system.  Filling defect lower pole consistent with her known calculus.  Prompt emptying of the left ureter under real-time fluoroscopy  3.  Semirigid ureteroscopy showed no evidence of stricture or mass.  Scattered ureteritis cystica ureteral mucosa-minimal  EBL: Minimal  Specimens: 1. None   Indication: LAKEYTA Teresa Carr is a 72 y.o. year old patient with bilateral, nonobstructing renal calculi.  A CT had shown left hydronephrosis and proximal hydroureter with out evidence of ureteral calculus.  A stricture in the proximal ureter was questioned. After reviewing the management options for treatment, the patient elected to proceed with the above surgical procedure(s). We have discussed the potential benefits and risks of the procedure, side effects of the proposed treatment, the likelihood of the patient achieving the goals of the procedure, and any potential problems that might occur during the procedure or recuperation. Informed consent has been obtained.   Description of procedure:  The patient was taken to the operating room and general anesthesia was induced.  The patient was placed in the dorsal lithotomy position, prepped and draped in the usual sterile fashion,  and preoperative antibiotics were administered. A preoperative time-out was performed.   A 21 French cystoscope was lubricated and passed under direct vision.  The urethra was normal in appearance.    Panendoscopy was performed and the bladder mucosa showed no erythema, solid or papillary lesions.  Attention then turned to the right ureteral orifice and a ureteral catheter was used to intubate the ureteral orifice.  Omnipaque contrast was injected through the ureteral catheter and a retrograde pyelogram was performed with findings as dictated above.  Attention then turned to the left ureteral orifice and a ureteral catheter was used to intubate the ureteral orifice.  Omnipaque contrast was injected through the ureteral catheter and a retrograde pyelogram was performed with findings as dictated above.  A 0.038 Sensor wire was then advanced up the left ureter into the renal pelvis under fluoroscopic guidance.  A 4.5 Fr semirigid ureteroscope was then advanced into the ureter next to the guidewire without difficulty and advanced proximally.  A few areas of ureteritis cystica were noted.  The ureteroscope was advanced to the renal pelvis.  There was no evidence of ureteral tumor, stone or stricture.  The ureteroscope was slowly withdrawn.  The guidewire was removed.  A ureteral stent was not placed.  The bladder was then emptied and the procedure ended.  The patient appeared to tolerate the procedure well and without complications.  After anesthetic reversal the patient was transported to the PACU in stable condition.   Plan: She will follow-up for discussion of PCNL for her left renal stone burden   John Giovanni, MD

## 2018-11-02 NOTE — Discharge Instructions (Signed)

## 2018-11-02 NOTE — Anesthesia Postprocedure Evaluation (Signed)
Anesthesia Post Note  Patient: Teresa Carr  Procedure(s) Performed: CYSTOSCOPY/RETROGRADE/URETEROSCOPY (Bilateral )  Patient location during evaluation: PACU Anesthesia Type: General Level of consciousness: awake and alert Pain management: pain level controlled Vital Signs Assessment: post-procedure vital signs reviewed and stable Respiratory status: spontaneous breathing, nonlabored ventilation, respiratory function stable and patient connected to nasal cannula oxygen Cardiovascular status: blood pressure returned to baseline and stable Postop Assessment: no apparent nausea or vomiting Anesthetic complications: no     Last Vitals:  Vitals:   11/02/18 1337 11/02/18 1349  BP:  (!) 155/90  Pulse: 76 80  Resp: 14 16  Temp: 36.7 C 36.8 C  SpO2: 97% 96%    Last Pain:  Vitals:   11/02/18 1349  TempSrc: Temporal  PainSc: 0-No pain                 Durenda Hurt

## 2018-11-02 NOTE — Anesthesia Procedure Notes (Signed)
Procedure Name: Intubation Date/Time: 11/02/2018 12:07 PM Performed by: Eben Burow, CRNA Pre-anesthesia Checklist: Patient identified, Emergency Drugs available, Suction available and Patient being monitored Patient Re-evaluated:Patient Re-evaluated prior to induction Oxygen Delivery Method: Circle system utilized Preoxygenation: Pre-oxygenation with 100% oxygen Induction Type: IV induction Ventilation: Mask ventilation without difficulty and Oral airway inserted - appropriate to patient size Laryngoscope Size: Sabra Heck and 2 Grade View: Grade I Tube type: Oral Tube size: 7.5 mm Number of attempts: 1 Airway Equipment and Method: Stylet Placement Confirmation: ETT inserted through vocal cords under direct vision,  positive ETCO2 and breath sounds checked- equal and bilateral Secured at: 20 cm Tube secured with: Tape Dental Injury: Teeth and Oropharynx as per pre-operative assessment

## 2018-11-02 NOTE — Telephone Encounter (Signed)
App made and mailed to patient ° °Michelle °

## 2018-11-02 NOTE — Telephone Encounter (Signed)
-----   Message from Abbie Sons, MD sent at 11/02/2018  1:09 PM EST ----- Regarding: Follow-up Please schedule postop follow-up appointment in 4-6 weeks

## 2018-11-02 NOTE — Anesthesia Post-op Follow-up Note (Signed)
Anesthesia QCDR form completed.        
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
## 2018-11-26 ENCOUNTER — Other Ambulatory Visit: Payer: Self-pay | Admitting: Urology

## 2018-11-26 DIAGNOSIS — N3946 Mixed incontinence: Secondary | ICD-10-CM

## 2018-12-10 ENCOUNTER — Ambulatory Visit (INDEPENDENT_AMBULATORY_CARE_PROVIDER_SITE_OTHER): Payer: Medicare Other | Admitting: Urology

## 2018-12-10 ENCOUNTER — Other Ambulatory Visit: Payer: Self-pay | Admitting: Radiology

## 2018-12-10 ENCOUNTER — Encounter: Payer: Self-pay | Admitting: Urology

## 2018-12-10 VITALS — BP 130/84 | HR 90 | Ht 68.0 in | Wt 330.0 lb

## 2018-12-10 DIAGNOSIS — N200 Calculus of kidney: Secondary | ICD-10-CM
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
## 2018-12-13 ENCOUNTER — Telehealth: Payer: Self-pay | Admitting: Urology

## 2018-12-13 NOTE — Telephone Encounter (Signed)
Left message for pt. To call the office and confirm her lab appt. On 12/16/18 for a UCX prior to surgery.

## 2018-12-16 ENCOUNTER — Other Ambulatory Visit: Payer: Medicare Other

## 2018-12-16 DIAGNOSIS — N200 Calculus of kidney: Secondary | ICD-10-CM

## 2018-12-19 LAB — CULTURE, URINE COMPREHENSIVE

## 2018-12-20 ENCOUNTER — Telehealth: Payer: Self-pay | Admitting: Urology

## 2018-12-20 ENCOUNTER — Other Ambulatory Visit: Payer: Self-pay | Admitting: Radiology

## 2018-12-20 NOTE — Telephone Encounter (Signed)
Patient was given the [facility] Information form below as well as the Instructions for Pre-Admission Testing form & a map of [facility].   [place], [place], [place] [address] Telephone: [phone] Fax: [phone]   Thank you for choosing [facility] for your upcoming surgery!  We are always here to assist in your urological needs.  Please read the following information with specific details for your upcoming appointments related to your surgery. Please contact Amy at [phone] Option 3 with any questions.  The Name of Your Surgery: Left ureteroscopy, laser lithotripsy,stone removal and stent placement  Your Surgery Date: 12/28/18 Your Surgeon: John Giovanni  Please call Same Day Surgery at [phone] between the hours of 1pm-3pm one day prior to your surgery. They will inform you of the time to arrive at Same Day Surgery which is located on the second floor of the [facility].   Please refer to the attached letter regarding instructions for Pre-Admission Testing. You will receive a call from the [facility] office regarding your appointment with them.  The Pre-Admission Testing office is located at [place], on the first floor of the [place] at [facility] in [place] (office is to the right as you enter through the [employer] of the [facility]). Please have all medications you are currently taking and your insurance card available.   Patient was advised to have nothing to eat or drink after midnight the night prior to surgery except that she may have only water until 2 hours before surgery with nothing to drink within 2 hours of surgery.  The patient states she currently takes Warfarin  & was informed to hold medication for 7 days prior to surgery with the approval from Dr. Lovie Macadamia  beginning on  12/21/18. Dr. Lovie Macadamia will also set up a Lovenox Bridge. Patient's questions were answered and she expressed understanding of these instructions.

## 2018-12-23 ENCOUNTER — Inpatient Hospital Stay: Admission: RE | Admit: 2018-12-23 | Payer: Medicare Other | Source: Ambulatory Visit

## 2018-12-24 ENCOUNTER — Other Ambulatory Visit: Payer: Self-pay

## 2018-12-24 ENCOUNTER — Encounter
Admission: RE | Admit: 2018-12-24 | Discharge: 2018-12-24 | Disposition: A | Discharge status: Home / Self Care | Payer: Medicare Other | Source: Ambulatory Visit | Attending: Urology | Admitting: Urology

## 2018-12-24 DIAGNOSIS — Z01812 Encounter for preprocedural laboratory examination: Secondary | ICD-10-CM | POA: Insufficient documentation

## 2018-12-24 HISTORY — DX: Chronic obstructive pulmonary disease, unspecified: J44.9

## 2018-12-24 LAB — CBC
HCT: 39.2 % (ref 36.0–46.0)
Hemoglobin: 12.6 g/dL (ref 12.0–15.0)
MCH: 29.7 pg (ref 26.0–34.0)
MCHC: 32.1 g/dL (ref 30.0–36.0)
MCV: 92.5 fL (ref 80.0–100.0)
NRBC: 0 % (ref 0.0–0.2)
Platelets: 222 10*3/uL (ref 150–400)
RBC: 4.24 MIL/uL (ref 3.87–5.11)
RDW: 13.8 % (ref 11.5–15.5)
WBC: 6.6 10*3/uL (ref 4.0–10.5)

## 2018-12-24 LAB — BASIC METABOLIC PANEL
ANION GAP: 9 (ref 5–15)
BUN: 15 mg/dL (ref 8–23)
CO2: 29 mmol/L (ref 22–32)
Calcium: 9 mg/dL (ref 8.9–10.3)
Chloride: 100 mmol/L (ref 98–111)
Creatinine, Ser: 0.78 mg/dL (ref 0.44–1.00)
GFR calc non Af Amer: >60 mL/min (ref >60)
Glucose, Bld: 212 mg/dL — ABNORMAL HIGH (ref 70–99)
Potassium: 3.8 mmol/L (ref 3.5–5.1)
Sodium: 138 mmol/L (ref 135–145)

## 2018-12-24 NOTE — Patient Instructions (Signed)
  Your procedure is scheduled on: Tuesday December 28, 2018 Report to Same Day Surgery 2nd floor [facility] Entr[facility]-take elevator on left to 2nd floor.  Check in with surgery information desk.) To find out your arrival time, call [phone] 1:00-3:00 PM on Monday December 27, 2018  Remember: Instructions that are not followed completely may result in serious medical risk, up to and including death, or upon the discretion of your surgeon and anesthesiologist your surgery may need to be rescheduled.    __x__ 1. Do not eat food (including mints, candies, chewing gum) after midnight the night before your procedure. You may drink water up to 2 hours before you are scheduled to arrive at the hospital for your procedure.  Do not drink anything within 2 hours of your scheduled arrival to the hospital.    __x__ 2. No Alcohol for 24 hours before or after surgery.   __x__ 3. No Smoking or e-cigarettes for 24 hours before surgery.  Do not use any chewable tobacco products for at least 6 hours before surgery.   __x__ 4. Notify your doctor if there is any change in your medical condition (cold, fever, infections).   __x__ 5. On the morning of surgery brush your teeth with toothpaste and water.  You may rinse your mouth with mouthwash if you wish.  Do not swallow any toothpaste or mouthwash.  Please read over the following fact sheets that you were given:  MRSA Information    __x__ Use antibacterial soap such as Dial to shower/bathe on the day of surgery.   Do not wear jewelry, make-up, hairpins, clips or nail polish on the day of surgery.  Do not wear lotions, powders, deodorant, or perfumes.   Do not shave below the face/neck 48 hours prior to surgery.   Do not bring valuables to the hospital.    [facility] is not responsible for any belongings or valuables.               Contacts, dentures or bridgework may not be worn into surgery.  For patients discharged on the day of  surgery, you will NOT be permitted to drive yourself home.  You must have a responsible adult with you for 24 hours after surgery.  __x__ Take these medicines on the morning of surgery with a SMALL SIP OF WATER:  1. Gabapentin  2. Venlafaxine  __x__ Use inhalers on the day of surgery and bring them with you to the hospital.  __x__ Bring C-Pap/Bi-Pap machine to the hospital.   __x__ Stop Metformin 2 days before surgery (Last dose Saturday December 25, 2018).    __x__ No insulin on the morning of surgery.   __x__ Follow recommendations from Cardiologist, Pulmonologist or PCP regarding stopping Aspirin, Coumadin, Plavix, Eliquis, Effient, Pradaxa, and Pletal.  Stopped Warfarin on 12/23/2018, started Lovenox bridge 12/23/2018  __x__ TODAY: Stop Anti-inflammatories such as Advil, Ibuprofen, Motrin, Aleve, Naproxen, Naprosyn, BC/Goodies powders or aspirin products. You may continue to take Tylenol and Celebrex.   __x__ TODAY: Stop supplements (Fish Oil, CBD oil) until after surgery. You may continue to take Vitamin D, Vitamin B, and multivitamin.

## 2018-12-27 MED ORDER — CEFAZOLIN SODIUM-DEXTROSE 2-4 GM/100ML-% IV SOLN
2.0000 g | INTRAVENOUS | Status: AC
  Administered 2018-12-28: 2 g via INTRAVENOUS

## 2018-12-28 ENCOUNTER — Encounter
Admission: RE | Disposition: A | Discharge status: Home / Self Care | Payer: Self-pay | Source: Ambulatory Visit | Attending: Urology

## 2018-12-28 ENCOUNTER — Encounter: Payer: Self-pay | Admitting: *Deleted

## 2018-12-28 ENCOUNTER — Ambulatory Visit: Payer: Medicare Other | Admitting: Anesthesiology

## 2018-12-28 ENCOUNTER — Other Ambulatory Visit: Payer: Self-pay

## 2018-12-28 ENCOUNTER — Ambulatory Visit
Admission: RE | Admit: 2018-12-28 | Discharge: 2018-12-28 | Disposition: A | Discharge status: Home / Self Care | Payer: Medicare Other | Source: Ambulatory Visit | Attending: Urology | Admitting: Urology

## 2018-12-28 DIAGNOSIS — E119 Type 2 diabetes mellitus without complications: Secondary | ICD-10-CM | POA: Insufficient documentation

## 2018-12-28 DIAGNOSIS — M1990 Unspecified osteoarthritis, unspecified site: Secondary | ICD-10-CM | POA: Diagnosis not present

## 2018-12-28 DIAGNOSIS — N200 Calculus of kidney: Secondary | ICD-10-CM | POA: Diagnosis not present

## 2018-12-28 DIAGNOSIS — Z9049 Acquired absence of other specified parts of digestive tract: Secondary | ICD-10-CM | POA: Diagnosis not present

## 2018-12-28 DIAGNOSIS — Z882 Allergy status to sulfonamides status: Secondary | ICD-10-CM | POA: Insufficient documentation

## 2018-12-28 DIAGNOSIS — Z87442 Personal history of urinary calculi: Secondary | ICD-10-CM | POA: Diagnosis not present

## 2018-12-28 DIAGNOSIS — J449 Chronic obstructive pulmonary disease, unspecified: Secondary | ICD-10-CM | POA: Insufficient documentation

## 2018-12-28 DIAGNOSIS — J45909 Unspecified asthma, uncomplicated: Secondary | ICD-10-CM | POA: Diagnosis not present

## 2018-12-28 DIAGNOSIS — Z6841 Body Mass Index (BMI) 40.0 and over, adult: Secondary | ICD-10-CM | POA: Insufficient documentation

## 2018-12-28 DIAGNOSIS — F329 Major depressive disorder, single episode, unspecified: Secondary | ICD-10-CM | POA: Insufficient documentation

## 2018-12-28 DIAGNOSIS — Z85528 Personal history of other malignant neoplasm of kidney: Secondary | ICD-10-CM | POA: Diagnosis not present

## 2018-12-28 DIAGNOSIS — Z885 Allergy status to narcotic agent status: Secondary | ICD-10-CM | POA: Diagnosis not present

## 2018-12-28 DIAGNOSIS — Z8614 Personal history of Methicillin resistant Staphylococcus aureus infection: Secondary | ICD-10-CM | POA: Diagnosis not present

## 2018-12-28 DIAGNOSIS — N132 Hydronephrosis with renal and ureteral calculous obstruction: Secondary | ICD-10-CM | POA: Insufficient documentation

## 2018-12-28 DIAGNOSIS — I10 Essential (primary) hypertension: Secondary | ICD-10-CM | POA: Insufficient documentation

## 2018-12-28 DIAGNOSIS — E785 Hyperlipidemia, unspecified: Secondary | ICD-10-CM | POA: Insufficient documentation

## 2018-12-28 DIAGNOSIS — M5410 Radiculopathy, site unspecified: Secondary | ICD-10-CM | POA: Diagnosis not present

## 2018-12-28 DIAGNOSIS — Z86718 Personal history of other venous thrombosis and embolism: Secondary | ICD-10-CM | POA: Diagnosis not present

## 2018-12-28 DIAGNOSIS — G4730 Sleep apnea, unspecified: Secondary | ICD-10-CM | POA: Diagnosis not present

## 2018-12-28 DIAGNOSIS — K219 Gastro-esophageal reflux disease without esophagitis: Secondary | ICD-10-CM | POA: Insufficient documentation

## 2018-12-28 HISTORY — PX: CYSTOSCOPY/URETEROSCOPY/HOLMIUM LASER/STENT PLACEMENT: SHX6546

## 2018-12-28 LAB — PROTIME-INR
INR: 1.13
Prothrombin Time: 14.4 seconds (ref 11.4–15.2)

## 2018-12-28 LAB — GLUCOSE, CAPILLARY
GLUCOSE-CAPILLARY: 158 mg/dL — AB (ref 70–99)
Glucose-Capillary: 179 mg/dL — ABNORMAL HIGH (ref 70–99)

## 2018-12-28 SURGERY — CYSTOSCOPY/URETEROSCOPY/HOLMIUM LASER/STENT PLACEMENT
Anesthesia: General | Laterality: Left

## 2018-12-28 MED ORDER — DEXAMETHASONE SODIUM PHOSPHATE 10 MG/ML IJ SOLN
INTRAMUSCULAR | Status: DC | PRN
  Administered 2018-12-28: 10 mg via INTRAVENOUS

## 2018-12-28 MED ORDER — FENTANYL CITRATE (PF) 100 MCG/2ML IJ SOLN
INTRAMUSCULAR | Status: AC
  Filled 2018-12-28: qty 2

## 2018-12-28 MED ORDER — FENTANYL CITRATE (PF) 100 MCG/2ML IJ SOLN
INTRAMUSCULAR | Status: DC | PRN
  Administered 2018-12-28 (×2): 50 ug via INTRAVENOUS

## 2018-12-28 MED ORDER — CEFAZOLIN SODIUM-DEXTROSE 2-4 GM/100ML-% IV SOLN
INTRAVENOUS | Status: AC
  Filled 2018-12-28: qty 100

## 2018-12-28 MED ORDER — SUGAMMADEX SODIUM 200 MG/2ML IV SOLN
INTRAVENOUS | Status: AC
  Filled 2018-12-28: qty 2

## 2018-12-28 MED ORDER — ONDANSETRON HCL 4 MG/2ML IJ SOLN
INTRAMUSCULAR | Status: AC
  Filled 2018-12-28: qty 4

## 2018-12-28 MED ORDER — ONDANSETRON HCL 4 MG/2ML IJ SOLN
INTRAMUSCULAR | Status: DC | PRN
  Administered 2018-12-28: 4 mg via INTRAVENOUS

## 2018-12-28 MED ORDER — ROCURONIUM BROMIDE 50 MG/5ML IV SOLN
INTRAVENOUS | Status: AC
  Filled 2018-12-28: qty 1

## 2018-12-28 MED ORDER — FAMOTIDINE 20 MG PO TABS
ORAL_TABLET | ORAL | Status: AC
  Administered 2018-12-28: 20 mg via ORAL
  Filled 2018-12-28: qty 1

## 2018-12-28 MED ORDER — MIDAZOLAM HCL 2 MG/2ML IJ SOLN
INTRAMUSCULAR | Status: AC
  Filled 2018-12-28: qty 2

## 2018-12-28 MED ORDER — ROCURONIUM BROMIDE 100 MG/10ML IV SOLN
INTRAVENOUS | Status: DC | PRN
  Administered 2018-12-28 (×2): 10 mg via INTRAVENOUS
  Administered 2018-12-28: 50 mg via INTRAVENOUS

## 2018-12-28 MED ORDER — LACTATED RINGERS IV SOLN
INTRAVENOUS | Status: DC | PRN
  Administered 2018-12-28: 07:00:00 via INTRAVENOUS

## 2018-12-28 MED ORDER — FAMOTIDINE 20 MG PO TABS
20.0000 mg | ORAL_TABLET | Freq: Once | ORAL | Status: AC
  Administered 2018-12-28: 20 mg via ORAL

## 2018-12-28 MED ORDER — PROPOFOL 10 MG/ML IV BOLUS
INTRAVENOUS | Status: AC
  Filled 2018-12-28: qty 20

## 2018-12-28 MED ORDER — SODIUM CHLORIDE 0.9 % IV SOLN: INTRAVENOUS | Status: DC

## 2018-12-28 MED ORDER — IOPAMIDOL (ISOVUE-200) INJECTION 41%
INTRAVENOUS | Status: DC | PRN
  Administered 2018-12-28: 40 mL via INTRAVENOUS

## 2018-12-28 MED ORDER — LIDOCAINE HCL (CARDIAC) PF 100 MG/5ML IV SOSY
PREFILLED_SYRINGE | INTRAVENOUS | Status: DC | PRN
  Administered 2018-12-28: 60 mg via INTRAVENOUS

## 2018-12-28 MED ORDER — ONDANSETRON HCL 4 MG/2ML IJ SOLN: 4.0000 mg | Freq: Once | INTRAMUSCULAR | Status: DC | PRN

## 2018-12-28 MED ORDER — PROPOFOL 10 MG/ML IV BOLUS
INTRAVENOUS | Status: DC | PRN
  Administered 2018-12-28: 150 mg via INTRAVENOUS

## 2018-12-28 MED ORDER — SUGAMMADEX SODIUM 500 MG/5ML IV SOLN
INTRAVENOUS | Status: DC | PRN
  Administered 2018-12-28: 350 mg via INTRAVENOUS

## 2018-12-28 MED ORDER — FENTANYL CITRATE (PF) 100 MCG/2ML IJ SOLN: 25.0000 ug | INTRAMUSCULAR | Status: DC | PRN

## 2018-12-28 MED ORDER — PHENYLEPHRINE HCL 10 MG/ML IJ SOLN
INTRAMUSCULAR | Status: DC | PRN
  Administered 2018-12-28 (×8): 100 ug via INTRAVENOUS

## 2018-12-28 SURGICAL SUPPLY — 28 items
BAG DRAIN CYSTO-URO LG1000N (MISCELLANEOUS) ×3 IMPLANT
BASKET ZERO TIP 1.9FR (BASKET) ×3 IMPLANT
BRUSH SCRUB EZ 1% IODOPHOR (MISCELLANEOUS) ×3 IMPLANT
CATH URETL 5X70 OPEN END (CATHETERS) ×3 IMPLANT
CNTNR SPEC 2.5X3XGRAD LEK (MISCELLANEOUS)
CONT SPEC 4OZ STER OR WHT (MISCELLANEOUS)
CONTAINER SPEC 2.5X3XGRAD LEK (MISCELLANEOUS) IMPLANT
DRAPE UTILITY 15X26 TOWEL STRL (DRAPES) ×3 IMPLANT
FIBER LASER LITHO 273 (Laser) IMPLANT
GLOVE BIO SURGEON STRL SZ8 (GLOVE) ×3 IMPLANT
GOWN STRL REUS W/ TWL LRG LVL3 (GOWN DISPOSABLE) ×1 IMPLANT
GOWN STRL REUS W/ TWL XL LVL3 (GOWN DISPOSABLE) ×1 IMPLANT
GOWN STRL REUS W/TWL LRG LVL3 (GOWN DISPOSABLE) ×2
GOWN STRL REUS W/TWL XL LVL3 (GOWN DISPOSABLE) ×2
GUIDEWIRE STR DUAL SENSOR (WIRE) ×6 IMPLANT
INFUSOR MANOMETER BAG 3000ML (MISCELLANEOUS) ×3 IMPLANT
INTRODUCER DILATOR DOUBLE (INTRODUCER) ×3 IMPLANT
KIT TURNOVER CYSTO (KITS) ×3 IMPLANT
PACK CYSTO AR (MISCELLANEOUS) ×3 IMPLANT
SET CYSTO W/LG BORE CLAMP LF (SET/KITS/TRAYS/PACK) ×3 IMPLANT
SHEATH URETERAL 12FRX35CM (MISCELLANEOUS) ×3 IMPLANT
SOL .9 NS 3000ML IRR  AL (IV SOLUTION) ×4
SOL .9 NS 3000ML IRR UROMATIC (IV SOLUTION) ×2 IMPLANT
STENT URET 6FRX24 CONTOUR (STENTS) ×3 IMPLANT
STENT URET 6FRX26 CONTOUR (STENTS) IMPLANT
SURGILUBE 2OZ TUBE FLIPTOP (MISCELLANEOUS) ×3 IMPLANT
VALVE UROSEAL ADJ ENDO (VALVE) ×3 IMPLANT
WATER STERILE IRR 1000ML POUR (IV SOLUTION) ×3 IMPLANT

## 2018-12-28 NOTE — Discharge Instructions (Signed)

## 2018-12-28 NOTE — Interval H&P Note (Signed)
History and Physical Interval Note:  12/28/2018 7:22 AM  Teresa Carr  has presented today for surgery, with the diagnosis of left nephrolithiasis  The various methods of treatment have been discussed with the patient and family. After consideration of risks, benefits and other options for treatment, the patient has consented to  Procedure(s): CYSTOSCOPY/URETEROSCOPY/HOLMIUM LASER/STENT PLACEMENT (Left) as a surgical intervention .  The patient's history has been reviewed, patient examined, no change in status, stable for surgery.  I have reviewed the patient's chart and labs.  Questions were answered to the patient's satisfaction.     [place]

## 2018-12-28 NOTE — Transfer of Care (Signed)
Immediate Anesthesia Transfer of Care Note  Patient: Teresa Carr  Procedure(s) Performed: CYSTOSCOPY/URETEROSCOPY/HOLMIUM LASER/STENT PLACEMENT (Left )  Patient Location: PACU  Anesthesia Type:General  Level of Consciousness: sedated  Airway & Oxygen Therapy: Patient Spontanous Breathing and Patient connected to face mask oxygen  Post-op Assessment: Report given to RN and Post -op Vital signs reviewed and stable  Post vital signs: Reviewed and stable  Last Vitals:  Vitals Value Taken Time  BP 138/84 12/28/2018 10:05 AM  Temp 36.6 C 12/28/2018 10:03 AM  Pulse 88 12/28/2018 10:06 AM  Resp 18 12/28/2018 10:06 AM  SpO2 98 % 12/28/2018 10:06 AM  Vitals shown include unvalidated device data.  Last Pain:  Vitals:   12/28/18 1003  TempSrc:   PainSc: 0-No pain         Complications: No apparent anesthesia complications

## 2018-12-28 NOTE — Op Note (Signed)
Preoperative diagnosis: Left nephrolithiasis   Postoperative diagnosis: Left nephrolithiasis  Procedure:  1. Cystoscopy 2. Left ureteroscopy and stone removal-staged 3. Ureteroscopic laser lithotripsy 4. Left ureteral stent placement (6FR) 24 cm 5. Left retrograde pyelography with interpretation  Surgeon: Nicki Reaper C. Keyleigh Manninen, M.D.  Anesthesia: General  Complications: None  Intraoperative findings:  1.  Left retrograde pyelography demonstrated a filling defect within a lower pole calyx consistent with the patient's known calculus without other abnormalities.  EBL: Minimal  Specimens: 1. Calculus fragments for analysis   Indication: Teresa Carr is a 73 y.o. 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  She declined PCNL and after reviewing the management options for treatment, the patient elected to proceed with the above surgical procedure(s). We have discussed the potential benefits and risks of the procedure, side effects of the proposed treatment, the likelihood of the patient achieving the goals of the procedure, and any potential problems that might occur during the procedure or recuperation. Informed consent has been obtained.  She understands this most likely will be a staged procedure due to the stone burden.  Description of procedure:  The patient was taken to the operating room and general anesthesia was induced.  The patient was placed in the dorsal lithotomy position, prepped and draped in the usual sterile fashion, and preoperative antibiotics were administered. A preoperative time-out was performed.   A 21 French cystoscope was lubricated and passed per urethra.  Panendoscopy was performed and the bladder mucosa showed no erythema, solid or papillary lesions.   Attention was directed to the left ureteral orifice and a 0.038 Sensor wire was then advanced up the left ureter into the renal pelvis under fluoroscopic  guidance.  The cystoscope was removed and a dual-lumen catheter was placed over the sensor wire.  Retrograde pyelogram was performed through the catheter with findings as described above.  A second sensor wire was placed in a similar fashion.  A 12/14 French ureteral access sheath was placed over the working wire under fluoroscopic guidance without difficulty.  A digital flexible ureteroscope was placed through the access sheath and advanced into the renal pelvis without difficulty.  There were multiple small calculi within a lower pole calyx.  The larger ones were removed with a 1.9 [place] nitinol basket.  A 273 m laser fiber was placed ureteroscope and the smaller ones were further fragmented with a popcorn technique.  The partial staghorn calculus was identified in a lower pole calyx.    The calculus was dusted at settings of 0.2 J / 40 Hz.  Due to the stone size this portion of the procedure was lengthy.  Approximately 60% of the calculus was treated and it was elected to stop the procedure based on anesthesia time and visualization.  A few larger fragments were removed with the basket.  All stone fragments were then removed from the collecting system with a zero tip nitinol basket.  Retrograde pyelogram was performed and each calyx was sequentially examined under fluoroscopic guidance and no significant size fragments were identified.  Repeat retrograde pyelograms performed and no extravasation was noted.  The ureteral access sheath and ureteroscope were removed in tandem and the ureter showed no evidence of injury or perforation.  A 6 FR/24 CM stent was then placed under fluoroscopic guidance.  The wire was then removed with an adequate stent curl noted in an upper pole calyx as well as in the bladder.  The bladder was then  emptied and the procedure ended.  The patient appeared to tolerate the procedure well and without complications.  After anesthetic reversal the patient was transported to  the PACU in stable condition.   Plan: Her stent will remain indwelling and she will be scheduled for follow-up ureteroscopy in the near future  John Giovanni, MD

## 2018-12-28 NOTE — Anesthesia Procedure Notes (Signed)
Procedure Name: Intubation Date/Time: 12/28/2018 7:39 AM Performed by: Lorie Apley, CRNA Pre-anesthesia Checklist: Patient identified, Patient being monitored, Timeout performed, Emergency Drugs available and Suction available Patient Re-evaluated:Patient Re-evaluated prior to induction Oxygen Delivery Method: Circle system utilized Preoxygenation: Pre-oxygenation with 100% oxygen Induction Type: IV induction Ventilation: Mask ventilation without difficulty Laryngoscope Size: Mac and 3 Grade View: Grade I Tube type: Oral Tube size: 7.0 mm Number of attempts: 1 Airway Equipment and Method: Stylet Placement Confirmation: ETT inserted through vocal cords under direct vision,  positive ETCO2 and breath sounds checked- equal and bilateral Secured at: 21 cm Tube secured with: Tape Dental Injury: Teeth and Oropharynx as per pre-operative assessment
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
## 2018-12-28 NOTE — Anesthesia Post-op Follow-up Note (Signed)
Anesthesia QCDR form completed.        

## 2018-12-29 NOTE — Anesthesia Postprocedure Evaluation (Signed)
Anesthesia Post Note  Patient: Teresa Carr  Procedure(s) Performed: CYSTOSCOPY/URETEROSCOPY/HOLMIUM LASER/STENT PLACEMENT (Left )  Patient location during evaluation: PACU Anesthesia Type: General Level of consciousness: awake and alert Pain management: pain level controlled Vital Signs Assessment: post-procedure vital signs reviewed and stable Respiratory status: spontaneous breathing, nonlabored ventilation, respiratory function stable and patient connected to nasal cannula oxygen Cardiovascular status: blood pressure returned to baseline and stable Postop Assessment: no apparent nausea or vomiting Anesthetic complications: no     Last Vitals:  Vitals:   12/28/18 1057 12/28/18 1158  BP: (!) 169/79 (!) 163/94  Pulse: 87 81  Resp: 17 16  Temp: (!) 36.2 C   SpO2: 94% 93%    Last Pain:  Vitals:   12/28/18 1158  TempSrc:   PainSc: 0-No pain                 Martha Clan
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
## 2019-01-10 ENCOUNTER — Ambulatory Visit (INDEPENDENT_AMBULATORY_CARE_PROVIDER_SITE_OTHER): Payer: Medicare Other | Admitting: Urology

## 2019-01-10 ENCOUNTER — Encounter: Payer: Self-pay | Admitting: Urology

## 2019-01-10 VITALS — BP 160/78 | HR 95 | Ht 70.0 in | Wt 330.0 lb

## 2019-01-10 DIAGNOSIS — N200 Calculus of kidney: Secondary | ICD-10-CM

## 2019-01-10 NOTE — Patient Instructions (Signed)
Dietary Guidelines to Help Prevent Kidney Stones Kidney stones are deposits of minerals and salts that form inside your kidneys. Your risk of developing kidney stones may be greater depending on your diet, your lifestyle, the medicines you take, and whether you have certain medical conditions. Most people can reduce their chances of developing kidney stones by following the instructions below. Depending on your overall health and the type of kidney stones you tend to develop, your dietitian may give you more specific instructions. What are tips for following this plan? Reading food labels  Choose foods with "no salt added" or "low-salt" labels. Limit your sodium intake to less than 1500 mg per day.  Choose foods with calcium for each meal and snack. Try to eat about 300 mg of calcium at each meal. Foods that contain 200-500 mg of calcium per serving include: ? 8 oz (237 ml) of milk, fortified nondairy milk, and fortified fruit juice. ? 8 oz (237 ml) of kefir, yogurt, and soy yogurt. ? 4 oz (118 ml) of tofu. ? 1 oz of cheese. ? 1 cup (300 g) of dried figs. ? 1 cup (91 g) of cooked broccoli. ? 1-3 oz can of sardines or mackerel.  Most people need 1000 to 1500 mg of calcium each day. Talk to your dietitian about how much calcium is recommended for you. Shopping  Buy plenty of fresh fruits and vegetables. Most people do not need to avoid fruits and vegetables, even if they contain nutrients that may contribute to kidney stones.  When shopping for convenience foods, choose: ? Whole pieces of fruit. ? Premade salads with dressing on the side. ? Low-fat fruit and yogurt smoothies.  Avoid buying frozen meals or prepared deli foods.  Look for foods with live cultures, such as yogurt and kefir. Cooking  Do not add salt to food when cooking. Place a salt shaker on the table and allow each person to add his or her own salt to taste.  Use vegetable protein, such as beans, textured vegetable  protein (TVP), or tofu instead of meat in pasta, casseroles, and soups. Meal planning   Eat less salt, if told by your dietitian. To do this: ? Avoid eating processed or premade food. ? Avoid eating fast food.  Eat less animal protein, including cheese, meat, poultry, or fish, if told by your dietitian. To do this: ? Limit the number of times you have meat, poultry, fish, or cheese each week. Eat a diet free of meat at least 2 days a week. ? Eat only one serving each day of meat, poultry, fish, or seafood. ? When you prepare animal protein, cut pieces into small portion sizes. For most meat and fish, one serving is about the size of one deck of cards.  Eat at least 5 servings of fresh fruits and vegetables each day. To do this: ? Keep fruits and vegetables on hand for snacks. ? Eat 1 piece of fruit or a handful of berries with breakfast. ? Have a salad and fruit at lunch. ? Have two kinds of vegetables at dinner.  Limit foods that are high in a substance called oxalate. These include: ? Spinach. ? Rhubarb. ? Beets. ? Potato chips and french fries. ? Nuts.  If you regularly take a diuretic medicine, make sure to eat at least 1-2 fruits or vegetables high in potassium each day. These include: ? Avocado. ? Banana. ? Orange, prune, carrot, or tomato juice. ? Baked potato. ? Cabbage. ? Beans and split   If you regularly take a diuretic medicine, make sure to eat at least 1-2 fruits or vegetables high in potassium each day. These include:  ? Avocado.  ? Banana.  ? Orange, prune, carrot, or tomato juice.  ? Baked potato.  ? Cabbage.  ? Beans and split peas.  General instructions     Drink enough fluid to keep your urine clear or pale yellow. This is the most important thing you can do.   Talk to your health care provider and dietitian about taking daily supplements. Depending on your health and the cause of your kidney stones, you may be advised:  ? Not to take supplements with vitamin C.  ? To take a calcium supplement.  ? To take a daily probiotic supplement.  ? To take other supplements such as magnesium, fish oil, or vitamin B6.   Take all medicines and supplements as told by your health care provider.   Limit alcohol intake to no  more than 1 drink a day for nonpregnant women and 2 drinks a day for men. One drink equals 12 oz of beer, 5 oz of wine, or 1 oz of hard liquor.   Lose weight if told by your health care provider. Work with your dietitian to find strategies and an eating plan that works best for you.  What foods are not recommended?  Limit your intake of the following foods, or as told by your dietitian. Talk to your dietitian about specific foods you should avoid based on the type of kidney stones and your overall health.  Grains  Breads. Bagels. Rolls. Baked goods. Salted crackers. Cereal. Pasta.  Vegetables  Spinach. Rhubarb. Beets. Canned vegetables. Pickles. Olives.  Meats and other protein foods  Nuts. Nut butters. Large portions of meat, poultry, or fish. Salted or cured meats. Deli meats. Hot dogs. Sausages.  Dairy  Cheese.  Beverages  Regular soft drinks. Regular vegetable juice.  Seasonings and other foods  Seasoning blends with salt. Salad dressings. Canned soups. Soy sauce. Ketchup. Barbecue sauce. Canned pasta sauce. Casseroles. Pizza. Lasagna. Frozen meals. Potato chips. French fries.  Summary   You can reduce your risk of kidney stones by making changes to your diet.   The most important thing you can do is drink enough fluid. You should drink enough fluid to keep your urine clear or pale yellow.   Ask your health care provider or dietitian how much protein from animal sources you should eat each day, and also how much salt and calcium you should have each day.  This information is not intended to replace advice given to you by your health care provider. Make sure you discuss any questions you have with your health care provider.  Document Released: 02/28/2011 Document Revised: 10/14/2016 Document Reviewed: 10/14/2016  Elsevier Interactive Patient Education  2019 [employer]

## 2019-01-11 LAB — CALCULI, WITH PHOTOGRAPH (CLINICAL LAB)
CaHPO4 (Brushite): 10 %
Calcium Oxalate Monohydrate: 60 %
Carbonate Apatite: 30 %

## 2019-01-11 LAB — CALCULI, WITH PHOTOGRAPH: WEIGHT CALCULI: 58.8 mg

## 2019-01-18 ENCOUNTER — Other Ambulatory Visit: Payer: Medicare Other

## 2019-01-18 DIAGNOSIS — Z01818 Encounter for other preprocedural examination: Secondary | ICD-10-CM

## 2019-01-19 LAB — MICROSCOPIC EXAMINATION
Epithelial Cells (non renal): NONE SEEN /hpf (ref 0–10)
RBC, UA: >30 /hpf — AB (ref 0–2)

## 2019-01-19 LAB — URINALYSIS, COMPLETE
Bilirubin, UA: NEGATIVE
Glucose, UA: NEGATIVE
KETONES UA: NEGATIVE
Nitrite, UA: POSITIVE — AB
SPEC GRAV UA: 1.02 (ref 1.005–1.030)
Urobilinogen, Ur: 0.2 mg/dL (ref 0.2–1.0)
pH, UA: 6 (ref 5.0–7.5)

## 2019-01-20 ENCOUNTER — Other Ambulatory Visit: Payer: Self-pay

## 2019-01-20 ENCOUNTER — Other Ambulatory Visit: Payer: Self-pay | Admitting: Radiology

## 2019-01-20 ENCOUNTER — Encounter: Payer: Self-pay | Admitting: *Deleted

## 2019-01-20 DIAGNOSIS — N200 Calculus of kidney: Secondary | ICD-10-CM

## 2019-01-20 NOTE — Pre-Procedure Instructions (Signed)
LM FOR LEAH AT DR STOIOFF THAT PATIENT INSTRUCTED TO STOP WARFARIN AS INSTRUCTED BY DR Lovie Macadamia AND ALSO LOVENOX AS INSTRUCTED

## 2019-01-20 NOTE — Patient Instructions (Signed)
Your procedure is scheduled on: 01/25/19 Report to Day Surgery. To find out your arrival time please call [phone] between 1PM - 3PM on 01/24/19.  Remember: Instructions that are not followed completely may result in serious medical risk,  up to and including death, or upon the discretion of your surgeon and anesthesiologist your  surgery may need to be rescheduled.     _X__ 1. Do not eat food after midnight the night before your procedure.                 No gum chewing or hard candies. You may drink clear liquids up to 2 hours                 before you are scheduled to arrive for your surgery- DO not drink clear                 liquids within 2 hours of the start of your surgery.                 Clear Liquids include:  water, apple juice without pulp, clear carbohydrate                 drink such as Clearfast of Gatorade, Black Coffee or Tea (Do not add                 anything to coffee or tea).  __X__2.  On the morning of surgery brush your teeth with toothpaste and water, you                may rinse your mouth with mouthwash if you wish.  Do not swallow any toothpaste of mouthwash.     _X__ 3.  No Alcohol for 24 hours before or after surgery.   _X__ 4.  Do Not Smoke or use e-cigarettes For 24 Hours Prior to Your Surgery.                 Do not use any chewable tobacco products for at least 6 hours prior to                 surgery.  ____  5.  Bring all medications with you on the day of surgery if instructed.   _X___  6.  Notify your doctor if there is any change in your medical condition      (cold, fever, infections).     Do not wear jewelry, make-up, hairpins, clips or nail polish. Do not wear lotions, powders, or perfumes. You may wear deodorant. Do not shave 48 hours prior to surgery. Men may shave face and neck. Do not bring valuables to the hospital.    [facility] is not responsible for any belongings or valuables.  Contacts, dentures or  bridgework may not be worn into surgery. Leave your suitcase in the car. After surgery it may be brought to your room. For patients admitted to the hospital, discharge time is determined by your treatment team.   Patients discharged the day of surgery will not be allowed to dri           _X___ Take these medicines the morning of surgery with A SIP OF WATER:    1. FLUOXETINE  2. GABAPENTIN  3. VENLAFAXINE  4.  5.  6.  ____ Fleet Enema (as directed)   ____ Use CHG Soap as directed  X____ Use inhalers on the day of surgery  AND BRING _X__ Stop metformin 2 days  prior to surgery    __X__ . No insulin the morning          of surgery.   _X___ Stop Coumadin/Plavix/aspirin on     STOP WARFARIN AS INSTRUCTED BY DR Lovie Macadamia  ____ Stop Anti-inflammatories on    __X__ Stop supplements until after surgery.  STOP FISH OIL AND CBD OIL TODAY 01/20/19 ___X_ Bring C-Pap to the hospital.

## 2019-01-21 ENCOUNTER — Telehealth: Payer: Self-pay | Admitting: Urology

## 2019-01-21 ENCOUNTER — Other Ambulatory Visit: Payer: Self-pay | Admitting: Urology

## 2019-01-21 ENCOUNTER — Other Ambulatory Visit: Payer: Self-pay | Admitting: Radiology

## 2019-01-21 DIAGNOSIS — N200 Calculus of kidney: Secondary | ICD-10-CM

## 2019-01-21 LAB — CULTURE, URINE COMPREHENSIVE

## 2019-01-21 MED ORDER — DOXYCYCLINE HYCLATE 100 MG PO CAPS: 100.0000 mg | ORAL_CAPSULE | Freq: Two times a day (BID) | ORAL | 0 refills | Status: AC

## 2019-01-21 NOTE — Progress Notes (Signed)
Rx doxycycline sent to pharmacy 

## 2019-01-21 NOTE — Telephone Encounter (Signed)
Called pt. To let her know an antibiotic was called in to her pharmacy and pt. Stated she would pick it up today.

## 2019-01-24 ENCOUNTER — Encounter: Payer: Self-pay | Admitting: Anesthesiology

## 2019-01-24 MED ORDER — OXACILLIN SODIUM IN DEXTROSE 2 GM/50ML IV SOLN: 2.0000 g | Freq: Once | INTRAVENOUS | Status: DC

## 2019-01-25 ENCOUNTER — Ambulatory Visit
Admission: RE | Admit: 2019-01-25 | Discharge: 2019-01-25 | Disposition: A | Discharge status: Home / Self Care | Payer: Medicare Other | Attending: Urology | Admitting: Urology

## 2019-01-25 ENCOUNTER — Ambulatory Visit: Payer: Medicare Other | Admitting: Anesthesiology

## 2019-01-25 ENCOUNTER — Encounter: Payer: Self-pay | Admitting: Urology

## 2019-01-25 ENCOUNTER — Encounter
Admission: RE | Disposition: A | Discharge status: Home / Self Care | Payer: Self-pay | Source: Home / Self Care | Attending: Urology

## 2019-01-25 DIAGNOSIS — Z87891 Personal history of nicotine dependence: Secondary | ICD-10-CM | POA: Diagnosis not present

## 2019-01-25 DIAGNOSIS — Z86718 Personal history of other venous thrombosis and embolism: Secondary | ICD-10-CM | POA: Diagnosis not present

## 2019-01-25 DIAGNOSIS — G4730 Sleep apnea, unspecified: Secondary | ICD-10-CM | POA: Diagnosis not present

## 2019-01-25 DIAGNOSIS — F329 Major depressive disorder, single episode, unspecified: Secondary | ICD-10-CM | POA: Diagnosis not present

## 2019-01-25 DIAGNOSIS — Z85528 Personal history of other malignant neoplasm of kidney: Secondary | ICD-10-CM | POA: Insufficient documentation

## 2019-01-25 DIAGNOSIS — N200 Calculus of kidney: Secondary | ICD-10-CM

## 2019-01-25 DIAGNOSIS — E785 Hyperlipidemia, unspecified: Secondary | ICD-10-CM | POA: Insufficient documentation

## 2019-01-25 DIAGNOSIS — Z6841 Body Mass Index (BMI) 40.0 and over, adult: Secondary | ICD-10-CM | POA: Insufficient documentation

## 2019-01-25 DIAGNOSIS — Z79899 Other long term (current) drug therapy: Secondary | ICD-10-CM | POA: Insufficient documentation

## 2019-01-25 DIAGNOSIS — J449 Chronic obstructive pulmonary disease, unspecified: Secondary | ICD-10-CM | POA: Insufficient documentation

## 2019-01-25 DIAGNOSIS — Z96651 Presence of right artificial knee joint: Secondary | ICD-10-CM | POA: Diagnosis not present

## 2019-01-25 DIAGNOSIS — I10 Essential (primary) hypertension: Secondary | ICD-10-CM | POA: Insufficient documentation

## 2019-01-25 DIAGNOSIS — Z7901 Long term (current) use of anticoagulants: Secondary | ICD-10-CM | POA: Diagnosis not present

## 2019-01-25 DIAGNOSIS — Z794 Long term (current) use of insulin: Secondary | ICD-10-CM | POA: Diagnosis not present

## 2019-01-25 DIAGNOSIS — E119 Type 2 diabetes mellitus without complications: Secondary | ICD-10-CM | POA: Diagnosis not present

## 2019-01-25 HISTORY — PX: CYSTOSCOPY/URETEROSCOPY/HOLMIUM LASER/STENT PLACEMENT: SHX6546

## 2019-01-25 HISTORY — PX: CYSTOSCOPY W/ URETERAL STENT PLACEMENT: SHX1429

## 2019-01-25 LAB — PROTIME-INR
INR: 1.1 (ref 0.8–1.2)
PROTHROMBIN TIME: 13.8 s (ref 11.4–15.2)

## 2019-01-25 LAB — GLUCOSE, CAPILLARY
Glucose-Capillary: 176 mg/dL — ABNORMAL HIGH (ref 70–99)
Glucose-Capillary: 162 mg/dL — ABNORMAL HIGH (ref 70–99)

## 2019-01-25 SURGERY — CYSTOSCOPY/URETEROSCOPY/HOLMIUM LASER/STENT PLACEMENT
Anesthesia: General | Site: Ureter | Laterality: Left

## 2019-01-25 MED ORDER — ONDANSETRON HCL 4 MG/2ML IJ SOLN: 4.0000 mg | Freq: Once | INTRAMUSCULAR | Status: DC | PRN

## 2019-01-25 MED ORDER — PHENYLEPHRINE HCL 10 MG/ML IJ SOLN
INTRAMUSCULAR | Status: DC | PRN
  Administered 2019-01-25 (×3): 150 ug via INTRAVENOUS
  Administered 2019-01-25 (×2): 200 ug via INTRAVENOUS
  Administered 2019-01-25: 150 ug via INTRAVENOUS

## 2019-01-25 MED ORDER — SODIUM CHLORIDE 0.9 % IV SOLN
INTRAVENOUS | Status: DC | PRN
  Administered 2019-01-25: 20 ug/min via INTRAVENOUS

## 2019-01-25 MED ORDER — FENTANYL CITRATE (PF) 100 MCG/2ML IJ SOLN
INTRAMUSCULAR | Status: DC | PRN
  Administered 2019-01-25 (×2): 25 ug via INTRAVENOUS
  Administered 2019-01-25: 50 ug via INTRAVENOUS

## 2019-01-25 MED ORDER — FAMOTIDINE 20 MG PO TABS
20.0000 mg | ORAL_TABLET | Freq: Once | ORAL | Status: AC
  Administered 2019-01-25: 20 mg via ORAL

## 2019-01-25 MED ORDER — PROPOFOL 10 MG/ML IV BOLUS
INTRAVENOUS | Status: DC | PRN
  Administered 2019-01-25: 180 mg via INTRAVENOUS

## 2019-01-25 MED ORDER — FENTANYL CITRATE (PF) 100 MCG/2ML IJ SOLN
INTRAMUSCULAR | Status: AC
  Filled 2019-01-25: qty 2

## 2019-01-25 MED ORDER — MIDAZOLAM HCL 2 MG/2ML IJ SOLN
INTRAMUSCULAR | Status: DC | PRN
  Administered 2019-01-25: 2 mg via INTRAVENOUS

## 2019-01-25 MED ORDER — ALBUTEROL SULFATE HFA 108 (90 BASE) MCG/ACT IN AERS
INHALATION_SPRAY | RESPIRATORY_TRACT | Status: DC | PRN
  Administered 2019-01-25: 6 via RESPIRATORY_TRACT

## 2019-01-25 MED ORDER — IOHEXOL 180 MG/ML  SOLN
INTRAMUSCULAR | Status: DC | PRN
  Administered 2019-01-25: 40 mL

## 2019-01-25 MED ORDER — ALBUTEROL SULFATE HFA 108 (90 BASE) MCG/ACT IN AERS
INHALATION_SPRAY | RESPIRATORY_TRACT | Status: AC
  Filled 2019-01-25: qty 6.7

## 2019-01-25 MED ORDER — FENTANYL CITRATE (PF) 100 MCG/2ML IJ SOLN
25.0000 ug | INTRAMUSCULAR | Status: DC | PRN
  Administered 2019-01-25 (×2): 25 ug via INTRAVENOUS

## 2019-01-25 MED ORDER — MIDAZOLAM HCL 2 MG/2ML IJ SOLN
INTRAMUSCULAR | Status: AC
  Filled 2019-01-25: qty 2

## 2019-01-25 MED ORDER — NAFCILLIN SODIUM 2 G IJ SOLR: 2.0000 g | INTRAMUSCULAR | Status: DC

## 2019-01-25 MED ORDER — FAMOTIDINE 20 MG PO TABS
ORAL_TABLET | ORAL | Status: AC
  Administered 2019-01-25: 20 mg via ORAL
  Filled 2019-01-25: qty 1

## 2019-01-25 MED ORDER — SODIUM CHLORIDE 0.9 % IV SOLN
2.0000 g | Freq: Once | INTRAVENOUS | Status: AC
  Administered 2019-01-25: 2 g via INTRAVENOUS
  Filled 2019-01-25: qty 2

## 2019-01-25 MED ORDER — LIDOCAINE HCL (CARDIAC) PF 100 MG/5ML IV SOSY
PREFILLED_SYRINGE | INTRAVENOUS | Status: DC | PRN
  Administered 2019-01-25: 100 mg via INTRAVENOUS

## 2019-01-25 MED ORDER — ROCURONIUM BROMIDE 100 MG/10ML IV SOLN
INTRAVENOUS | Status: DC | PRN
  Administered 2019-01-25: 50 mg via INTRAVENOUS
  Administered 2019-01-25: 20 mg via INTRAVENOUS

## 2019-01-25 MED ORDER — FENTANYL CITRATE (PF) 100 MCG/2ML IJ SOLN
INTRAMUSCULAR | Status: AC
  Administered 2019-01-25: 25 ug via INTRAVENOUS
  Filled 2019-01-25: qty 2

## 2019-01-25 MED ORDER — PROPOFOL 10 MG/ML IV BOLUS
INTRAVENOUS | Status: AC
  Filled 2019-01-25: qty 40

## 2019-01-25 MED ORDER — SUCCINYLCHOLINE CHLORIDE 20 MG/ML IJ SOLN
INTRAMUSCULAR | Status: DC | PRN
  Administered 2019-01-25: 100 mg via INTRAVENOUS

## 2019-01-25 MED ORDER — LACTATED RINGERS IV SOLN
INTRAVENOUS | Status: DC
  Administered 2019-01-25 (×2): via INTRAVENOUS

## 2019-01-25 MED ORDER — SUGAMMADEX SODIUM 200 MG/2ML IV SOLN
INTRAVENOUS | Status: DC | PRN
  Administered 2019-01-25: 300 mg via INTRAVENOUS

## 2019-01-25 MED ORDER — EPHEDRINE SULFATE 50 MG/ML IJ SOLN
INTRAMUSCULAR | Status: DC | PRN
  Administered 2019-01-25: 5 mg via INTRAVENOUS
  Administered 2019-01-25: 10 mg via INTRAVENOUS

## 2019-01-25 SURGICAL SUPPLY — 28 items
BAG DRAIN CYSTO-URO LG1000N (MISCELLANEOUS) ×3 IMPLANT
BASKET ZERO TIP 1.9FR (BASKET) ×3 IMPLANT
BRUSH SCRUB EZ 1% IODOPHOR (MISCELLANEOUS) ×3 IMPLANT
CATH URETL 5X70 OPEN END (CATHETERS) IMPLANT
CNTNR SPEC 2.5X3XGRAD LEK (MISCELLANEOUS)
CONT SPEC 4OZ STER OR WHT (MISCELLANEOUS)
CONTAINER SPEC 2.5X3XGRAD LEK (MISCELLANEOUS) IMPLANT
DRAPE UTILITY 15X26 TOWEL STRL (DRAPES) ×3 IMPLANT
FIBER LASER LITHO 273 (Laser) ×3 IMPLANT
GLOVE BIO SURGEON STRL SZ8 (GLOVE) ×3 IMPLANT
GOWN STRL REUS W/ TWL LRG LVL3 (GOWN DISPOSABLE) ×1 IMPLANT
GOWN STRL REUS W/ TWL XL LVL3 (GOWN DISPOSABLE) ×1 IMPLANT
GOWN STRL REUS W/TWL LRG LVL3 (GOWN DISPOSABLE) ×2
GOWN STRL REUS W/TWL XL LVL3 (GOWN DISPOSABLE) ×2
GUIDEWIRE STR DUAL SENSOR (WIRE) ×6 IMPLANT
INFUSOR MANOMETER BAG 3000ML (MISCELLANEOUS) ×3 IMPLANT
INTRODUCER DILATOR DOUBLE (INTRODUCER) ×3 IMPLANT
KIT TURNOVER CYSTO (KITS) ×3 IMPLANT
PACK CYSTO AR (MISCELLANEOUS) ×3 IMPLANT
SET CYSTO W/LG BORE CLAMP LF (SET/KITS/TRAYS/PACK) ×3 IMPLANT
SHEATH URETERAL 12FRX35CM (MISCELLANEOUS) ×3 IMPLANT
SOL .9 NS 3000ML IRR  AL (IV SOLUTION) ×2
SOL .9 NS 3000ML IRR UROMATIC (IV SOLUTION) ×1 IMPLANT
STENT URET 6FRX24 CONTOUR (STENTS) ×3 IMPLANT
STENT URET 6FRX26 CONTOUR (STENTS) IMPLANT
SURGILUBE 2OZ TUBE FLIPTOP (MISCELLANEOUS) ×3 IMPLANT
VALVE UROSEAL ADJ ENDO (VALVE) ×3 IMPLANT
WATER STERILE IRR 1000ML POUR (IV SOLUTION) ×3 IMPLANT

## 2019-01-25 NOTE — Discharge Instructions (Signed)
DISCHARGE INSTRUCTIONS FOR KIDNEY STONE/URETERAL STENT   MEDICATIONS:  1. Resume all your other meds from home.  2.  AZO (over-the-counter) can help with the burning/stinging when you urinate. 3.  You may resume Coumadin 01/26/2019   ACTIVITY:  1. May resume regular activities in 24 hours. 2. No driving while on narcotic pain medications  3. Drink plenty of water  4. Continue to walk at home - you can still get blood clots when you are at home, so keep active, but don't over do it.  5. May return to work/school tomorrow or when you feel ready   BATHING:  1. You can shower.  SIGNS/SYMPTOMS TO CALL:  Please call us if you have a fever greater than 101.5, uncontrolled nausea/vomiting, uncontrolled pain, dizziness, unable to urinate, excessively bloody urine, chest pain, shortness of breath, leg swelling, leg pain, or any other concerns or questions.   Urinary frequency, urgency, bladder spasm and blood in the urine is normal  You can reach [place] at [phone].   FOLLOW-UP:  1. You will be contacted for the appointment for stent removal   AMBULATORY SURGERY  DISCHARGE INSTRUCTIONS   1) The drugs that you were given will stay in your system until tomorrow so for the next 24 hours you should not:  A) Drive an automobile B) Make any legal decisions C) Drink any alcoholic beverage   2) You may resume regular meals tomorrow.  Today it is better to start with liquids and gradually work up to solid foods.  You may eat anything you prefer, but it is better to start with liquids, then soup and crackers, and gradually work up to solid foods.   3) Please notify your doctor immediately if you have any unusual bleeding, trouble breathing, redness and pain at the surgery site, drainage, fever, or pain not relieved by medication.    4) Additional Instructions:        Please contact your physician with any problems or Same Day Surgery at [phone], Monday through Friday 6 am to  4 pm, or  at [facility] number at [phone].

## 2019-01-25 NOTE — Anesthesia Postprocedure Evaluation (Signed)
Anesthesia Post Note  Patient: Teresa Carr  Procedure(s) Performed: CYSTOSCOPY/URETEROSCOPY/HOLMIUM LASER/STENT PLACEMENT (Left Ureter) CYSTOSCOPY WITH STENT REPLACEMENT (Left Ureter)  Patient location during evaluation: PACU Anesthesia Type: General Level of consciousness: awake and alert Pain management: pain level controlled Vital Signs Assessment: post-procedure vital signs reviewed and stable Respiratory status: spontaneous breathing, nonlabored ventilation, respiratory function stable and patient connected to nasal cannula oxygen Cardiovascular status: blood pressure returned to baseline and stable Postop Assessment: no apparent nausea or vomiting Anesthetic complications: no     Last Vitals:  Vitals:   01/25/19 1303 01/25/19 1318  BP: (!) 125/53 (!) 126/58  Pulse: 87 80  Resp: 16 19  Temp:    SpO2: 98% 98%    Last Pain:  Vitals:   01/25/19 1318  TempSrc:   PainSc: 0-No pain                 [name]

## 2019-01-25 NOTE — Op Note (Signed)
Preoperative diagnosis: Left nephrolithiasis   Postoperative diagnosis: Left nephrolithiasis  Procedure:  1. Cystoscopy 2. Left ureteroscopy and stone removal 3. Ureteroscopic laser lithotripsy 4. Left ureteral stent placement (6FR) 24 cm 5. Left retrograde pyelography with interpretation  Surgeon: Nicki Reaper C. , M.D.  Anesthesia: General  Complications: None  Intraoperative findings:  1.  Left retrograde pyelography demonstrated a filling defect within the lower pole calyx consistent with the patient's known calculus without other abnormalities.  2.  Left retrograde pyelography post procedure showed no filling defects, stone fragments or contrast extravasation  EBL: Minimal  Specimens: 1. Calculus fragments for analysis   Indication: Teresa Carr is a 73 y.o. year old patient with a left partial staghorn calculus who presents for follow-up stage ureteroscopy. After reviewing the management options for treatment, the patient elected to proceed with the above surgical procedure(s). We have discussed the potential benefits and risks of the procedure, side effects of the proposed treatment, the likelihood of the patient achieving the goals of the procedure, and any potential problems that might occur during the procedure or recuperation. Informed consent has been obtained.  Description of procedure:  The patient was taken to the operating room and general anesthesia was induced.  The patient was placed in the dorsal lithotomy position, prepped and draped in the usual sterile fashion, and preoperative antibiotics were administered. A preoperative time-out was performed.   A 21 French cystoscope was lubricated and passed per urethra.  Panendoscopy was performed and the bladder mucosa showed no solid or papillary lesions.  Mild inflammatory changes at the left ureteral orifice secondary to her indwelling stent.  Attention was directed to the left ureteral orifice and the stent  was grasped with endoscopic forceps and brought out to the urethral meatus.  A 0.038 Sensor wire was placed through the stent advanced to the renal pelvis under fluoroscopic guidance.  The cystoscope was removed and a dual-lumen catheter was placed over the sensor wire.  Retrograde pyelogram was performed with findings as described above.  A second sensor wire was placed in a similar fashion.  A 12/14 French ureteral access sheath was placed over the working wire under fluoroscopic guidance without difficulty.  A digital flexible ureteroscope was placed through the access sheath and advanced into the renal pelvis without difficulty.  Multiple small fragments and a larger fragment was identified in a lower pole calyx.  Just inferior and another calyx were multiple small fragments from her prior procedure along with a 10 to 15 mm remaining fragment.  A 273 micron holmium laser fiber was placed through the ureteroscope and the stone was dusted at a setting of 0.2 J and frequency of 40 hz.   All significantly sized stone fragments were then removed from the collecting system with a zero tip nitinol basket.  Retrograde pyelogram was performed and each calyx was sequentially examined under fluoroscopic guidance and no significant size fragments were identified.  The ureteral access sheath and ureteroscope were removed in tandem and the ureter showed no evidence of injury or perforation.  A 6 FR/24 CM stent was was placed under fluoroscopic guidance.  The wire was then removed with an adequate stent curl noted in the renal pelvis as well as in the bladder.  The bladder was then emptied and the procedure ended.  The patient appeared to tolerate the procedure well and without complications.  After anesthetic reversal the patient was transported to the PACU in stable condition.   Plan: The patient will follow-up in  2 weeks for cystoscopy with stent removal.  John Giovanni, MD

## 2019-01-25 NOTE — Interval H&P Note (Signed)
History and Physical Interval Note:  01/25/2019 10:21 AM  Teresa Carr  has presented today for surgery, with the diagnosis of left Nephrolithiasis.  The various methods of treatment have been discussed with the patient and family. After consideration of risks, benefits and other options for treatment, the patient has consented to  Procedure(s): CYSTOSCOPY/URETEROSCOPY/HOLMIUM LASER/STENT PLACEMENT (Left) as a surgical intervention.  The patient's history has been reviewed, patient examined, no change in status, stable for surgery.  I have reviewed the patient's chart and labs.  Questions were answered to the patient's satisfaction.     [place]

## 2019-01-25 NOTE — Telephone Encounter (Signed)
-----   Message from Abbie Sons, MD sent at 01/25/2019  1:30 PM EDT ----- Regarding: Stent removal Please schedule cystoscopy with stent removal approximately 2 weeks

## 2019-01-25 NOTE — Anesthesia Procedure Notes (Signed)
Procedure Name: Intubation Date/Time: 01/25/2019 11:16 AM Performed by: Lavone Orn, CRNA Pre-anesthesia Checklist: Patient identified, Emergency Drugs available, Suction available, Patient being monitored and Timeout performed Patient Re-evaluated:Patient Re-evaluated prior to induction Oxygen Delivery Method: Circle system utilized Preoxygenation: Pre-oxygenation with 100% oxygen Induction Type: IV induction Ventilation: Mask ventilation without difficulty and Oral airway inserted - appropriate to patient size Laryngoscope Size: Mac and 4 Grade View: Grade I Tube type: Oral Tube size: 7.5 mm Number of attempts: 1 Airway Equipment and Method: Stylet Placement Confirmation: ETT inserted through vocal cords under direct vision,  positive ETCO2 and breath sounds checked- equal and bilateral Secured at: 21 cm Tube secured with: Tape Dental Injury: Teeth and Oropharynx as per pre-operative assessment

## 2019-01-25 NOTE — Anesthesia Post-op Follow-up Note (Signed)
Anesthesia QCDR form completed.        

## 2019-01-25 NOTE — Transfer of Care (Signed)
Immediate Anesthesia Transfer of Care Note  Patient: Teresa Carr  Procedure(s) Performed: CYSTOSCOPY/URETEROSCOPY/HOLMIUM LASER/STENT PLACEMENT (Left Ureter) CYSTOSCOPY WITH STENT REPLACEMENT (Left Ureter)  Patient Location: PACU  Anesthesia Type:General  Level of Consciousness: awake and drowsy  Airway & Oxygen Therapy: Patient Spontanous Breathing and Patient connected to face mask oxygen  Post-op Assessment: Report given to RN and Post -op Vital signs reviewed and stable  Post vital signs: stable  Last Vitals:  Vitals Value Taken Time  BP 135/59 01/25/2019 12:48 PM  Temp 36.3 C 01/25/2019 12:48 PM  Pulse 98 01/25/2019 12:56 PM  Resp 21 01/25/2019 12:56 PM  SpO2 96 % 01/25/2019 12:56 PM  Vitals shown include unvalidated device data.  Last Pain:  Vitals:   01/25/19 0950  TempSrc: Temporal         Complications: No apparent anesthesia complications

## 2019-01-25 NOTE — Telephone Encounter (Signed)
App made and mailed to patient  Lm to cb to confirm  [facility]

## 2019-01-25 NOTE — Anesthesia Preprocedure Evaluation (Addendum)
Anesthesia Evaluation  Patient identified by MRN, date of birth, ID band Patient awake    Reviewed: Allergy & Precautions, NPO status , Patient's Chart, lab work & pertinent test results, reviewed documented beta blocker date and time   Airway Mallampati: III  TM Distance: >3 FB     Dental  (+) Upper Dentures, Lower Dentures   Pulmonary asthma , sleep apnea , COPD, former smoker,           Cardiovascular hypertension, Pt. on medications      Neuro/Psych  Headaches, PSYCHIATRIC DISORDERS Depression  Neuromuscular disease    GI/Hepatic GERD  Controlled,  Endo/Other  diabetes, Type 2Morbid obesity  Renal/GU Renal disease     Musculoskeletal  (+) Arthritis ,   Abdominal   Peds  Hematology   Anesthesia Other Findings EKG ok. Renal ca.  Reproductive/Obstetrics                            Anesthesia Physical Anesthesia Plan  ASA: III  Anesthesia Plan: General   Post-op Pain Management:    Induction: Intravenous  PONV Risk Score and Plan:   Airway Management Planned: Oral ETT  Additional Equipment:   Intra-op Plan:   Post-operative Plan:   Informed Consent: I have reviewed the patients History and Physical, chart, labs and discussed the procedure including the risks, benefits and alternatives for the proposed anesthesia with the patient or authorized representative who has indicated his/her understanding and acceptance.       Plan Discussed with: CRNA  Anesthesia Plan Comments:        Anesthesia Quick Evaluation

## 2019-02-04 ENCOUNTER — Ambulatory Visit: Payer: Medicare Other | Admitting: Urology

## 2019-02-08 ENCOUNTER — Encounter: Payer: Self-pay | Admitting: Urology

## 2019-02-08 ENCOUNTER — Other Ambulatory Visit: Payer: Self-pay

## 2019-02-08 ENCOUNTER — Ambulatory Visit: Payer: Medicare Other | Admitting: Urology

## 2019-02-08 VITALS — BP 145/76 | HR 92 | Ht 70.0 in | Wt 351.8 lb

## 2019-02-08 LAB — URINALYSIS, COMPLETE
Bilirubin, UA: NEGATIVE
GLUCOSE, UA: NEGATIVE
KETONES UA: NEGATIVE
Nitrite, UA: POSITIVE — AB
Specific Gravity, UA: 1.025 (ref 1.005–1.030)
Urobilinogen, Ur: 0.2 mg/dL (ref 0.2–1.0)
pH, UA: 6 (ref 5.0–7.5)

## 2019-02-08 LAB — MICROSCOPIC EXAMINATION

## 2019-02-08 NOTE — Patient Instructions (Signed)
Dietary Guidelines to Help Prevent Kidney Stones Kidney stones are deposits of minerals and salts that form inside your kidneys. Your risk of developing kidney stones may be greater depending on your diet, your lifestyle, the medicines you take, and whether you have certain medical conditions. Most people can reduce their chances of developing kidney stones by following the instructions below. Depending on your overall health and the type of kidney stones you tend to develop, your dietitian may give you more specific instructions. What are tips for following this plan? Reading food labels  Choose foods with "no salt added" or "low-salt" labels. Limit your sodium intake to less than 1500 mg per day.  Choose foods with calcium for each meal and snack. Try to eat about 300 mg of calcium at each meal. Foods that contain 200-500 mg of calcium per serving include: ? 8 oz (237 ml) of milk, fortified nondairy milk, and fortified fruit juice. ? 8 oz (237 ml) of kefir, yogurt, and soy yogurt. ? 4 oz (118 ml) of tofu. ? 1 oz of cheese. ? 1 cup (300 g) of dried figs. ? 1 cup (91 g) of cooked broccoli. ? 1-3 oz can of sardines or mackerel.  Most people need 1000 to 1500 mg of calcium each day. Talk to your dietitian about how much calcium is recommended for you. Shopping  Buy plenty of fresh fruits and vegetables. Most people do not need to avoid fruits and vegetables, even if they contain nutrients that may contribute to kidney stones.  When shopping for convenience foods, choose: ? Whole pieces of fruit. ? Premade salads with dressing on the side. ? Low-fat fruit and yogurt smoothies.  Avoid buying frozen meals or prepared deli foods.  Look for foods with live cultures, such as yogurt and kefir. Cooking  Do not add salt to food when cooking. Place a salt shaker on the table and allow each person to add his or her own salt to taste.  Use vegetable protein, such as beans, textured vegetable  protein (TVP), or tofu instead of meat in pasta, casseroles, and soups. Meal planning   Eat less salt, if told by your dietitian. To do this: ? Avoid eating processed or premade food. ? Avoid eating fast food.  Eat less animal protein, including cheese, meat, poultry, or fish, if told by your dietitian. To do this: ? Limit the number of times you have meat, poultry, fish, or cheese each week. Eat a diet free of meat at least 2 days a week. ? Eat only one serving each day of meat, poultry, fish, or seafood. ? When you prepare animal protein, cut pieces into small portion sizes. For most meat and fish, one serving is about the size of one deck of cards.  Eat at least 5 servings of fresh fruits and vegetables each day. To do this: ? Keep fruits and vegetables on hand for snacks. ? Eat 1 piece of fruit or a handful of berries with breakfast. ? Have a salad and fruit at lunch. ? Have two kinds of vegetables at dinner.  Limit foods that are high in a substance called oxalate. These include: ? Spinach. ? Rhubarb. ? Beets. ? Potato chips and french fries. ? Nuts.  If you regularly take a diuretic medicine, make sure to eat at least 1-2 fruits or vegetables high in potassium each day. These include: ? Avocado. ? Banana. ? Orange, prune, carrot, or tomato juice. ? Baked potato. ? Cabbage. ? Beans and split   If you regularly take a diuretic medicine, make sure to eat at least 1-2 fruits or vegetables high in potassium each day. These include:  ? Avocado.  ? Banana.  ? Orange, prune, carrot, or tomato juice.  ? Baked potato.  ? Cabbage.  ? Beans and split peas.  General instructions     Drink enough fluid to keep your urine clear or pale yellow. This is the most important thing you can do.   Talk to your health care provider and dietitian about taking daily supplements. Depending on your health and the cause of your kidney stones, you may be advised:  ? Not to take supplements with vitamin C.  ? To take a calcium supplement.  ? To take a daily probiotic supplement.  ? To take other supplements such as magnesium, fish oil, or vitamin B6.   Take all medicines and supplements as told by your health care provider.   Limit alcohol intake to no  more than 1 drink a day for nonpregnant women and 2 drinks a day for men. One drink equals 12 oz of beer, 5 oz of wine, or 1 oz of hard liquor.   Lose weight if told by your health care provider. Work with your dietitian to find strategies and an eating plan that works best for you.  What foods are not recommended?  Limit your intake of the following foods, or as told by your dietitian. Talk to your dietitian about specific foods you should avoid based on the type of kidney stones and your overall health.  Grains  Breads. Bagels. Rolls. Baked goods. Salted crackers. Cereal. Pasta.  Vegetables  Spinach. Rhubarb. Beets. Canned vegetables. Pickles. Olives.  Meats and other protein foods  Nuts. Nut butters. Large portions of meat, poultry, or fish. Salted or cured meats. Deli meats. Hot dogs. Sausages.  Dairy  Cheese.  Beverages  Regular soft drinks. Regular vegetable juice.  Seasonings and other foods  Seasoning blends with salt. Salad dressings. Canned soups. Soy sauce. Ketchup. Barbecue sauce. Canned pasta sauce. Casseroles. Pizza. Lasagna. Frozen meals. Potato chips. French fries.  Summary   You can reduce your risk of kidney stones by making changes to your diet.   The most important thing you can do is drink enough fluid. You should drink enough fluid to keep your urine clear or pale yellow.   Ask your health care provider or dietitian how much protein from animal sources you should eat each day, and also how much salt and calcium you should have each day.  This information is not intended to replace advice given to you by your health care provider. Make sure you discuss any questions you have with your health care provider.  Document Released: 02/28/2011 Document Revised: 10/14/2016 Document Reviewed: 10/14/2016  Elsevier Interactive Patient Education  2019 [employer]

## 2019-02-09 NOTE — Progress Notes (Signed)
Stent removal was postponed due to nitrite positive urine.  Urine culture was ordered and stent removal rescheduled.

## 2019-02-14 ENCOUNTER — Other Ambulatory Visit: Payer: Self-pay | Admitting: Family Medicine

## 2019-02-14 ENCOUNTER — Telehealth: Payer: Self-pay | Admitting: Urology

## 2019-02-14 DIAGNOSIS — N200 Calculus of kidney: Secondary | ICD-10-CM

## 2019-02-14 NOTE — Telephone Encounter (Signed)
It does not look like her urine ever got sent for culture.  Unfortunately we will need to get another specimen to send for culture

## 2019-02-14 NOTE — Telephone Encounter (Signed)
Patient called and said she was supposed to have gotten an ABX last week for her infection for a UTI, she was to have her stent out tomorrow the 31st but since she never got the script I moved it to the 14th. Can we get her ABX called into her pharmacy asap please   Thanks, Sharyn Lull

## 2019-02-15 ENCOUNTER — Telehealth: Payer: Self-pay | Admitting: Urology

## 2019-02-15 ENCOUNTER — Other Ambulatory Visit: Payer: Medicare Other | Admitting: Urology

## 2019-02-15 ENCOUNTER — Other Ambulatory Visit: Payer: Medicare Other

## 2019-02-15 NOTE — Telephone Encounter (Signed)
Patient never showed for [place] UA and culture app on 02-15-19 left a message for her to cb    [place]

## 2019-02-15 NOTE — Telephone Encounter (Signed)
Pt scheduled, orders placed.

## 2019-02-16 ENCOUNTER — Telehealth: Payer: Self-pay | Admitting: Urology

## 2019-02-16 NOTE — Telephone Encounter (Signed)
Spoke with patient-will come in tomorrow for a urine specimen scheduled lab visit. Verbalized understanding.

## 2019-02-16 NOTE — Telephone Encounter (Signed)
Pt called and states that she needs an antibiotic called in for her UTI. Please advise.

## 2019-02-17 ENCOUNTER — Other Ambulatory Visit: Payer: Self-pay

## 2019-02-17 ENCOUNTER — Other Ambulatory Visit: Payer: Medicare Other

## 2019-02-17 DIAGNOSIS — N200 Calculus of kidney: Secondary | ICD-10-CM

## 2019-02-17 LAB — URINALYSIS, COMPLETE
Bilirubin, UA: NEGATIVE
Glucose, UA: NEGATIVE
Ketones, UA: NEGATIVE
Nitrite, UA: POSITIVE — AB
Specific Gravity, UA: 1.02 (ref 1.005–1.030)
Urobilinogen, Ur: 0.2 mg/dL (ref 0.2–1.0)
pH, UA: 6 (ref 5.0–7.5)

## 2019-02-17 LAB — MICROSCOPIC EXAMINATION: RBC: >10 /hpf — ABNORMAL HIGH (ref 0–2)

## 2019-02-21 ENCOUNTER — Telehealth: Payer: Self-pay

## 2019-02-21 ENCOUNTER — Other Ambulatory Visit: Payer: Self-pay | Admitting: Urology

## 2019-02-21 LAB — CULTURE, URINE COMPREHENSIVE

## 2019-02-21 MED ORDER — NITROFURANTOIN MONOHYD MACRO 100 MG PO CAPS: 100.0000 mg | ORAL_CAPSULE | Freq: Two times a day (BID) | ORAL | 0 refills | Status: AC

## 2019-02-21 NOTE — Telephone Encounter (Signed)
Called and advised patient of culture results and Dr. Dene Gentry message. Patient states that she is still in agony and wants to know why she can't start antibiotics prior to Sat 02/26/19. Will route to Dr Bernardo Heater and advise patient as such.

## 2019-02-21 NOTE — Telephone Encounter (Signed)
I only sent in 5 days worth of antibiotics.  Please send an an additional Rx so that she is still on antibiotics until the 14th which is the day her stent is removed.

## 2019-02-21 NOTE — Telephone Encounter (Signed)
-----   Message from Abbie Sons, MD sent at 02/21/2019  8:52 AM EDT ----- Urine culture was positive.  Antibiotic Rx was sent to pharmacy.  This most likely represents colonization so she does not need to start the antibiotic until 02/26/2019.

## 2019-02-22 MED ORDER — NITROFURANTOIN MONOHYD MACRO 100 MG PO CAPS: 100.0000 mg | ORAL_CAPSULE | Freq: Two times a day (BID) | ORAL | 0 refills | Status: AC

## 2019-02-22 NOTE — Telephone Encounter (Signed)
Left message for patient to return call to office. Mychart message also sent advising of new Rx at pharmacy.

## 2019-03-01 ENCOUNTER — Ambulatory Visit (INDEPENDENT_AMBULATORY_CARE_PROVIDER_SITE_OTHER): Payer: Medicare Other | Admitting: Urology

## 2019-03-01 ENCOUNTER — Other Ambulatory Visit: Payer: Self-pay

## 2019-03-01 ENCOUNTER — Encounter: Payer: Self-pay | Admitting: Urology

## 2019-03-01 VITALS — BP 130/80 | HR 80 | Ht 70.0 in | Wt 330.0 lb

## 2019-03-01 DIAGNOSIS — N200 Calculus of kidney: Secondary | ICD-10-CM

## 2019-03-01 LAB — URINALYSIS, COMPLETE
Bilirubin, UA: NEGATIVE
Glucose, UA: NEGATIVE
Ketones, UA: NEGATIVE
Nitrite, UA: NEGATIVE
Specific Gravity, UA: 1.025 (ref 1.005–1.030)
Urobilinogen, Ur: 0.2 mg/dL (ref 0.2–1.0)
pH, UA: 6 (ref 5.0–7.5)

## 2019-03-01 LAB — MICROSCOPIC EXAMINATION: RBC, Urine: >30 /hpf — AB (ref 0–2)

## 2019-03-01 MED ORDER — LIDOCAINE HCL URETHRAL/MUCOSAL 2 % EX GEL
1.0000 "application " | Freq: Once | CUTANEOUS | Status: AC
  Administered 2019-03-01: 1 via URETHRAL

## 2019-03-01 NOTE — Patient Instructions (Signed)
Dietary Guidelines to Help Prevent Kidney Stones Kidney stones are deposits of minerals and salts that form inside your kidneys. Your risk of developing kidney stones may be greater depending on your diet, your lifestyle, the medicines you take, and whether you have certain medical conditions. Most people can reduce their chances of developing kidney stones by following the instructions below. Depending on your overall health and the type of kidney stones you tend to develop, your dietitian may give you more specific instructions. What are tips for following this plan? Reading food labels  Choose foods with "no salt added" or "low-salt" labels. Limit your sodium intake to less than 1500 mg per day.  Choose foods with calcium for each meal and snack. Try to eat about 300 mg of calcium at each meal. Foods that contain 200-500 mg of calcium per serving include: ? 8 oz (237 ml) of milk, fortified nondairy milk, and fortified fruit juice. ? 8 oz (237 ml) of kefir, yogurt, and soy yogurt. ? 4 oz (118 ml) of tofu. ? 1 oz of cheese. ? 1 cup (300 g) of dried figs. ? 1 cup (91 g) of cooked broccoli. ? 1-3 oz can of sardines or mackerel.  Most people need 1000 to 1500 mg of calcium each day. Talk to your dietitian about how much calcium is recommended for you. Shopping  Buy plenty of fresh fruits and vegetables. Most people do not need to avoid fruits and vegetables, even if they contain nutrients that may contribute to kidney stones.  When shopping for convenience foods, choose: ? Whole pieces of fruit. ? Premade salads with dressing on the side. ? Low-fat fruit and yogurt smoothies.  Avoid buying frozen meals or prepared deli foods.  Look for foods with live cultures, such as yogurt and kefir. Cooking  Do not add salt to food when cooking. Place a salt shaker on the table and allow each person to add his or her own salt to taste.  Use vegetable protein, such as beans, textured vegetable  protein (TVP), or tofu instead of meat in pasta, casseroles, and soups. Meal planning   Eat less salt, if told by your dietitian. To do this: ? Avoid eating processed or premade food. ? Avoid eating fast food.  Eat less animal protein, including cheese, meat, poultry, or fish, if told by your dietitian. To do this: ? Limit the number of times you have meat, poultry, fish, or cheese each week. Eat a diet free of meat at least 2 days a week. ? Eat only one serving each day of meat, poultry, fish, or seafood. ? When you prepare animal protein, cut pieces into small portion sizes. For most meat and fish, one serving is about the size of one deck of cards.  Eat at least 5 servings of fresh fruits and vegetables each day. To do this: ? Keep fruits and vegetables on hand for snacks. ? Eat 1 piece of fruit or a handful of berries with breakfast. ? Have a salad and fruit at lunch. ? Have two kinds of vegetables at dinner.  Limit foods that are high in a substance called oxalate. These include: ? Spinach. ? Rhubarb. ? Beets. ? Potato chips and french fries. ? Nuts.  If you regularly take a diuretic medicine, make sure to eat at least 1-2 fruits or vegetables high in potassium each day. These include: ? Avocado. ? Banana. ? Orange, prune, carrot, or tomato juice. ? Baked potato. ? Cabbage. ? Beans and split   If you regularly take a diuretic medicine, make sure to eat at least 1-2 fruits or vegetables high in potassium each day. These include:  ? Avocado.  ? Banana.  ? Orange, prune, carrot, or tomato juice.  ? Baked potato.  ? Cabbage.  ? Beans and split peas.  General instructions     Drink enough fluid to keep your urine clear or pale yellow. This is the most important thing you can do.   Talk to your health care provider and dietitian about taking daily supplements. Depending on your health and the cause of your kidney stones, you may be advised:  ? Not to take supplements with vitamin C.  ? To take a calcium supplement.  ? To take a daily probiotic supplement.  ? To take other supplements such as magnesium, fish oil, or vitamin B6.   Take all medicines and supplements as told by your health care provider.   Limit alcohol intake to no  more than 1 drink a day for nonpregnant women and 2 drinks a day for men. One drink equals 12 oz of beer, 5 oz of wine, or 1 oz of hard liquor.   Lose weight if told by your health care provider. Work with your dietitian to find strategies and an eating plan that works best for you.  What foods are not recommended?  Limit your intake of the following foods, or as told by your dietitian. Talk to your dietitian about specific foods you should avoid based on the type of kidney stones and your overall health.  Grains  Breads. Bagels. Rolls. Baked goods. Salted crackers. Cereal. Pasta.  Vegetables  Spinach. Rhubarb. Beets. Canned vegetables. Pickles. Olives.  Meats and other protein foods  Nuts. Nut butters. Large portions of meat, poultry, or fish. Salted or cured meats. Deli meats. Hot dogs. Sausages.  Dairy  Cheese.  Beverages  Regular soft drinks. Regular vegetable juice.  Seasonings and other foods  Seasoning blends with salt. Salad dressings. Canned soups. Soy sauce. Ketchup. Barbecue sauce. Canned pasta sauce. Casseroles. Pizza. Lasagna. Frozen meals. Potato chips. French fries.  Summary   You can reduce your risk of kidney stones by making changes to your diet.   The most important thing you can do is drink enough fluid. You should drink enough fluid to keep your urine clear or pale yellow.   Ask your health care provider or dietitian how much protein from animal sources you should eat each day, and also how much salt and calcium you should have each day.  This information is not intended to replace advice given to you by your health care provider. Make sure you discuss any questions you have with your health care provider.  Document Released: 02/28/2011 Document Revised: 10/14/2016 Document Reviewed: 10/14/2016  Elsevier Interactive Patient Education  2019 [employer]

## 2019-03-01 NOTE — Progress Notes (Signed)
Indications: Patient is 73 y.o. female status post staged left ureteroscopy with laser lithotripsy of a partial staghorn calculus.  Her last procedure was on 01/25/2019.  Recent stent removal was postponed due to bacteriuria.  Other than moderate stent irritative symptoms she has no complaints.    Procedure:  Flexible Cystoscopy with stent removal (85027)  Timeout was performed and the correct patient, procedure and participants were identified.    Description:  The patient was prepped and draped in the usual sterile fashion. Flexible cystosopy was performed.  The stent was visualized, grasped, and removed intact without difficulty. The patient tolerated the procedure well.  A single dose of oral antibiotics was given.  Complications:  None  Plan: She was instructed to call for development of fever or flank pain status post stent removal.  Otherwise recommend a 6-week follow-up with renal ultrasound/KUB prior to the appointment.  John Giovanni, MD

## 2019-04-12 ENCOUNTER — Other Ambulatory Visit: Payer: Self-pay

## 2019-04-12 ENCOUNTER — Ambulatory Visit
Admission: RE | Admit: 2019-04-12 | Discharge: 2019-04-12 | Disposition: A | Discharge status: Home / Self Care | Payer: Medicare Other | Source: Ambulatory Visit | Attending: Urology | Admitting: Urology

## 2019-04-12 DIAGNOSIS — N200 Calculus of kidney: Secondary | ICD-10-CM | POA: Insufficient documentation

## 2019-04-14 ENCOUNTER — Telehealth (INDEPENDENT_AMBULATORY_CARE_PROVIDER_SITE_OTHER): Payer: Medicare Other | Admitting: Urology

## 2019-04-14 ENCOUNTER — Other Ambulatory Visit: Payer: Self-pay

## 2019-04-14 NOTE — Progress Notes (Signed)
Teresa Carr was scheduled for a telephone visit today however was unable to establish contact.  Will reschedule the visit.

## 2019-04-21 ENCOUNTER — Telehealth (INDEPENDENT_AMBULATORY_CARE_PROVIDER_SITE_OTHER): Payer: Medicare Other | Admitting: Urology

## 2019-04-21 ENCOUNTER — Other Ambulatory Visit: Payer: Self-pay

## 2019-04-21 DIAGNOSIS — N200 Calculus of kidney: Secondary | ICD-10-CM

## 2019-04-25 NOTE — Progress Notes (Signed)
Virtual Visit via Telephone Note  I connected with Teresa Carr on 04/21/19 at 12:00 PM EDT by telephone and verified that I am speaking with the correct person using two identifiers.  Location: Patient: Home Provider: Office   I discussed the limitations, risks, security and privacy concerns of performing an evaluation and management service by telephone and the availability of in person appointments. I also discussed with the patient that there may be a patient responsible charge related to this service. The patient expressed understanding and agreed to proceed.   History of Present Illness: 73 year old female presents for telephone follow-up of nephrolithiasis secondary to COVID-19 pandemic.  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  Her stent was removed on 03/01/2019.  Since stent removal she denies flank or abdominal pain.  Her only complaint is urinary urgency with urge incontinence.  She has failed anticholinergic medications as well as Myrbetriq and PTNS.  She denies gross hematuria.  Her stone was incompletely treated with a left lower calyceal remain calculus.  Renal ultrasound performed on 04/12/2019 showed nonobstructing left lower pole calculus as well as a 50 mm mid left renal calculus.  Renal cysts were also noted.  No hydronephrosis was seen.  She also has a 14 mm right upper pole renal calculus.   Observations/Objective: N/A  Assessment and Plan: 73 year old female status post staged ureteroscopy for a partial staghorn calculus.  She declined PCNL.  She does have nonobstructing stone burden remaining however declines further treatment.  She will call for development of flank or abdominal pain and will otherwise see her back in 4 months for an office visit and KUB.  Follow Up Instructions: As above   I discussed the assessment and treatment plan with the patient. The patient  was provided an opportunity to ask questions and all were answered. The patient agreed with the plan and demonstrated an understanding of the instructions.   The patient was advised to call back or seek an in-person evaluation if the symptoms worsen or if the condition fails to improve as anticipated.  I provided 9 minutes of non-face-to-face time during this encounter.   Abbie Sons, MD

## 2019-08-25 ENCOUNTER — Encounter: Payer: Self-pay | Admitting: Urology

## 2019-08-25 ENCOUNTER — Ambulatory Visit: Payer: Medicare Other | Admitting: Urology

## 2020-03-01 IMAGING — CT CT HEAD W/O CM
3 of 7 series · 14 of 47 positions shown, 17 images · non-contrast
Comparison: None.

CLINICAL DATA: Patient fell 2 days ago hitting head on cement.
Occipital headache.

EXAM:
CT HEAD WITHOUT CONTRAST
TECHNIQUE: Contiguous axial images were obtained from the base of the skull
through the vertex without intravenous contrast.

[Series 2: head wo · axial · 0.42mm/px · z∈[-102,+23]mm · 9 of 31 slices shown, 12 images]
[im 3/31  brain]
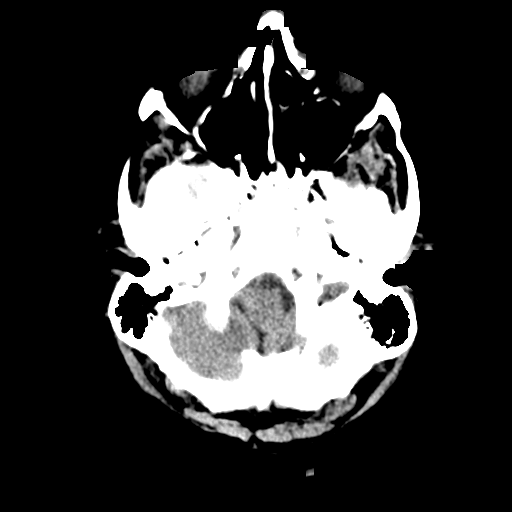
[im 3/31  bone]
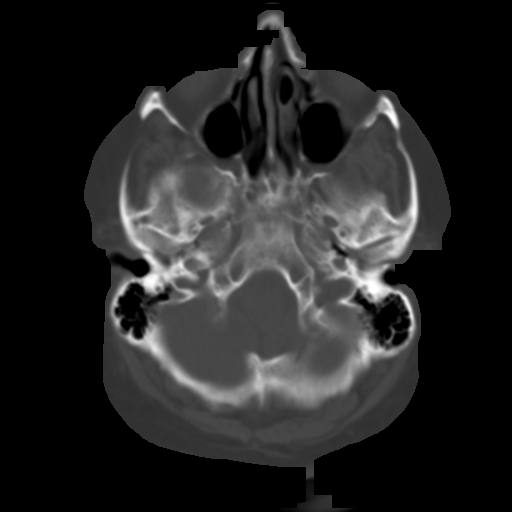
[im 6/31  brain]
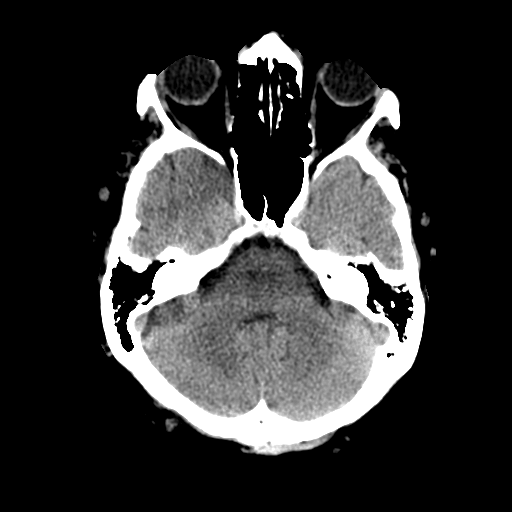
[im 11/31  brain]
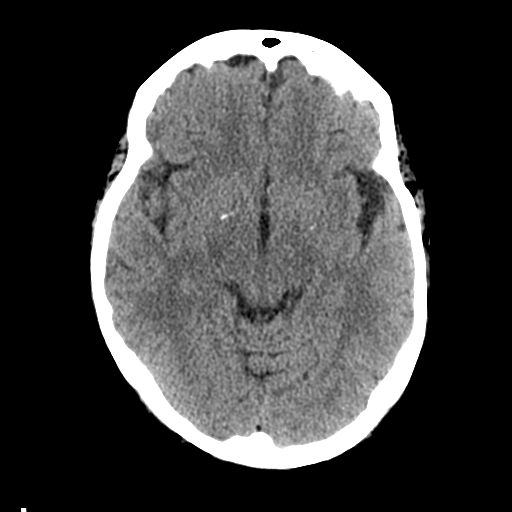
[im 13/31  brain]
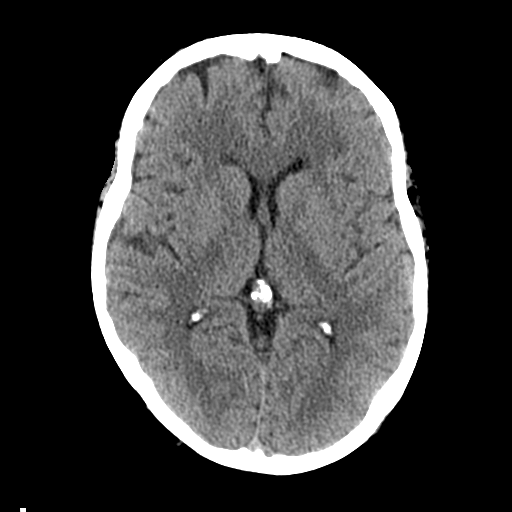
[im 16/31  brain]
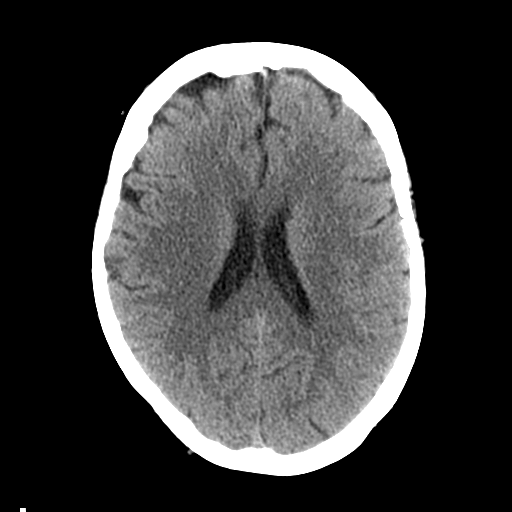
[im 16/31  bone]
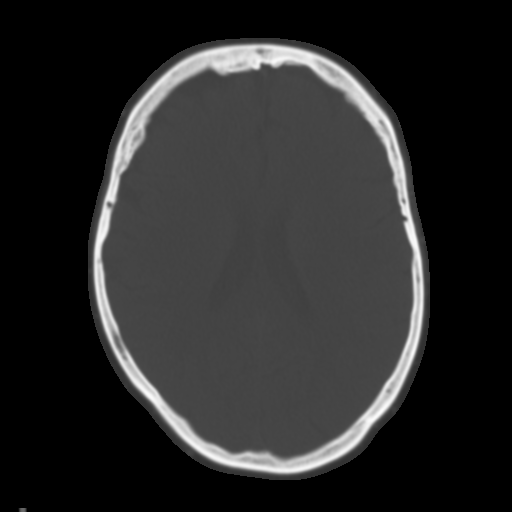
[im 18/31  brain]
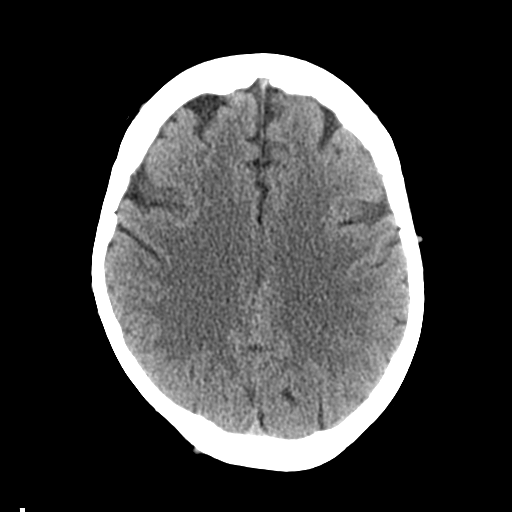
[im 21/31  brain]
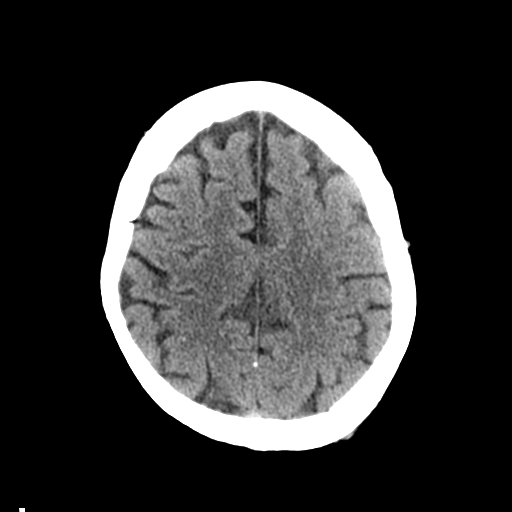
[im 26/31  brain]
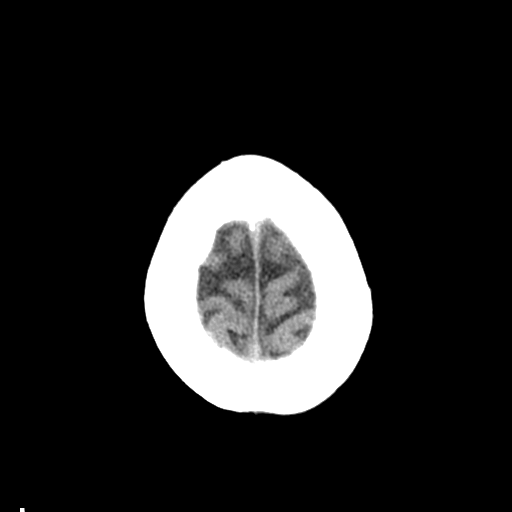
[im 28/31  brain]
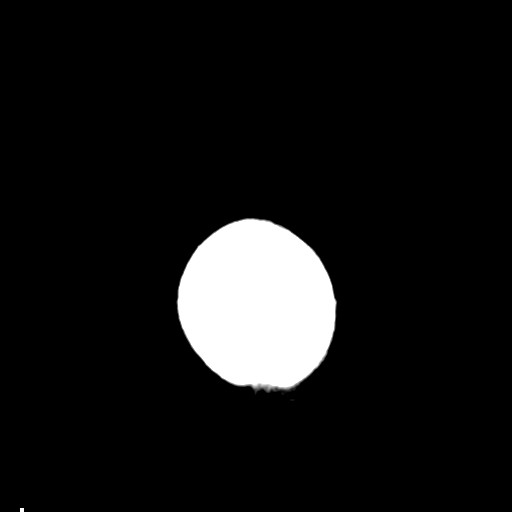
[im 28/31  bone]
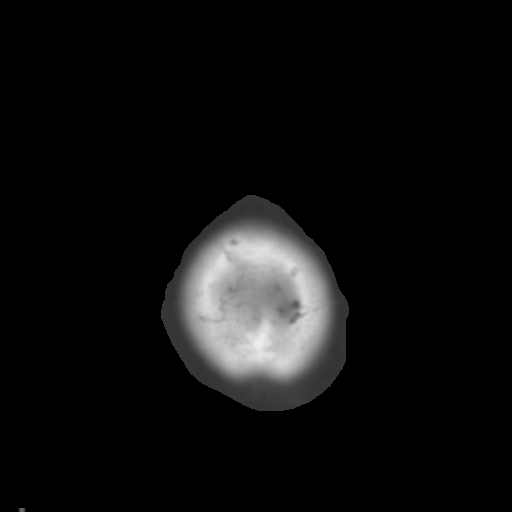

[Series 6: coronal soft tissue · coronal · 0.31mm/px · 3 of 61 slices shown]
[im 16/61  brain]
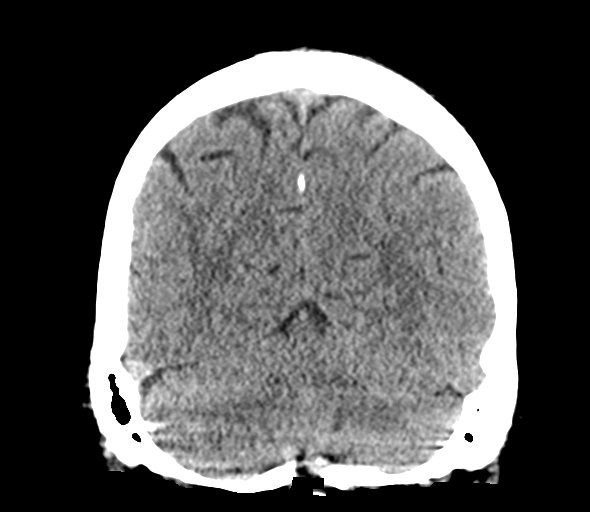
[im 31/61  brain]
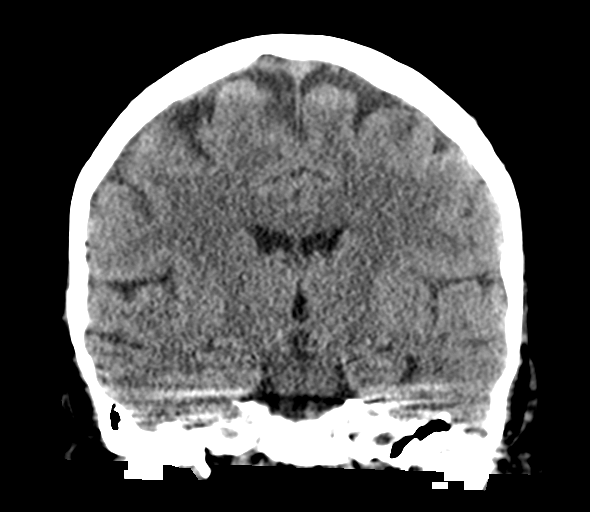
[im 46/61  brain]
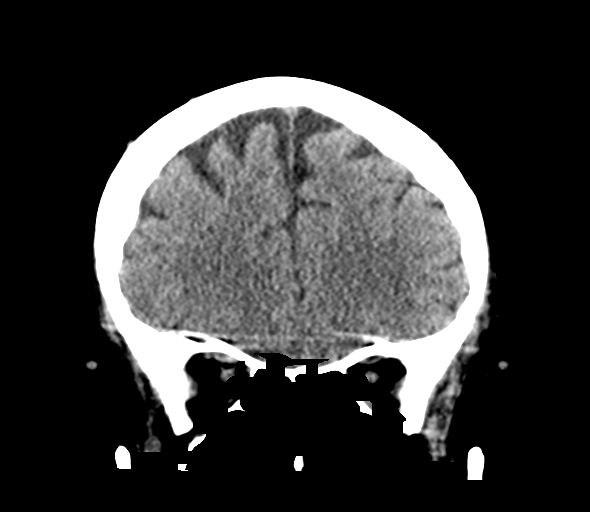

[Series 9: sagittal soft tissue · sagittal · 0.20mm/px · 2 of 53 slices shown]
[im 18/53  brain]
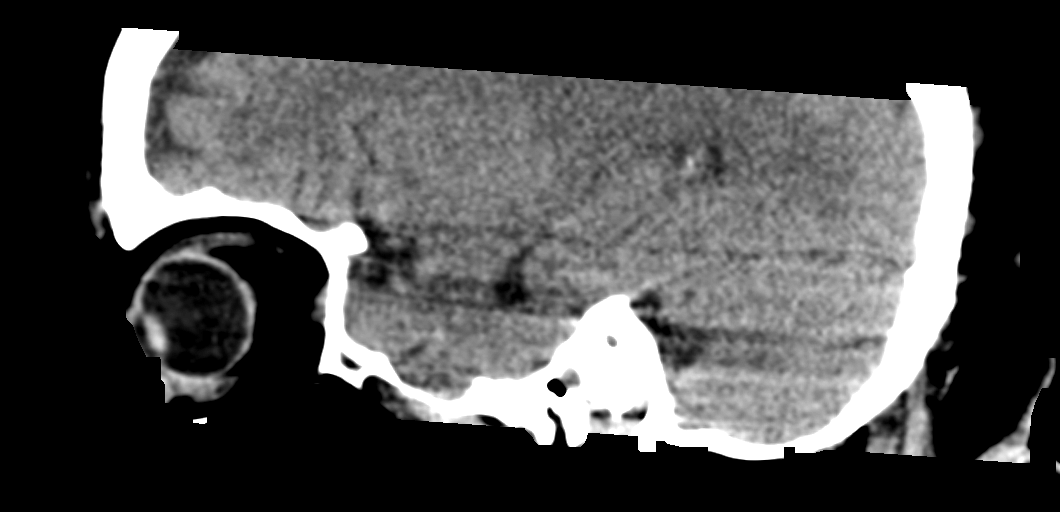
[im 35/53  brain]
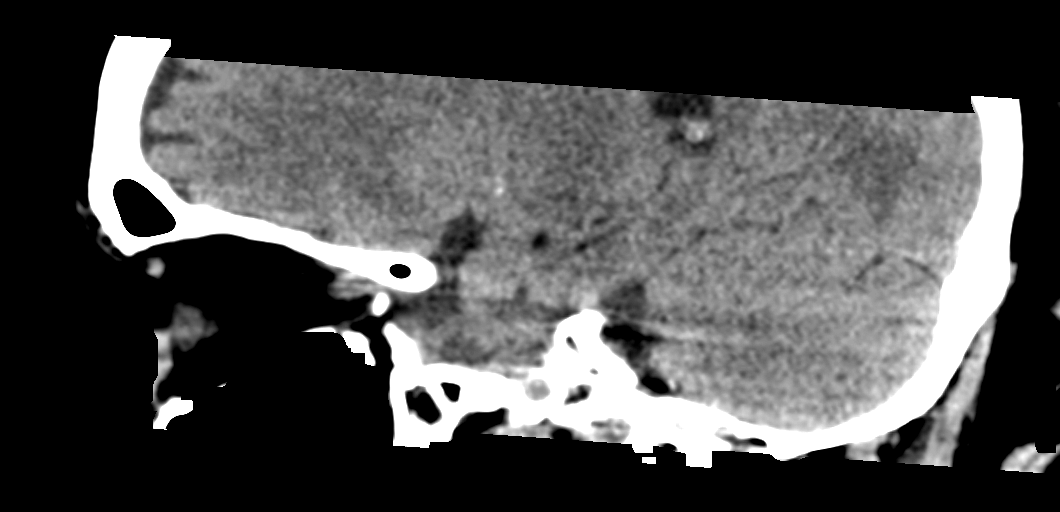

[14 of 47 positions shown; findings below may reference images not displayed]

FINDINGS: Brain: Age-appropriate involutional changes of the brain. Chronic
minimal small vessel ischemic disease of periventricular and
subcortical white matter. No hydrocephalus. Midline fourth ventricle
and basal cisterns. No intra-axial mass nor extra-axial fluid
collections. No acute intracranial hemorrhage.

Vascular: Moderate atherosclerosis of the carotid siphons. No
hyperdense vessel sign.

Skull: No acute skull fracture.

Sinuses/Orbits: Clear mastoids and paranasal sinuses. Intact orbits
and globes.

Other: No significant soft tissue swelling of the scalp.
IMPRESSION: No acute intracranial abnormality. Chronic appearing small vessel
ischemic disease.

## 2020-03-01 IMAGING — CR DG RIBS W/ CHEST 3+V*R*
1 series · 6 of 6 positions shown · non-contrast
Comparison: None.

CLINICAL DATA: Right rib pain after fall on sidewalk [REDACTED].

EXAM:
RIGHT RIBS AND CHEST - 3+ VIEW

[Series 1: dg ribs unilateral w/chest right · 0.14mm/px · 6 of 6 slices shown]
[im 1/6]
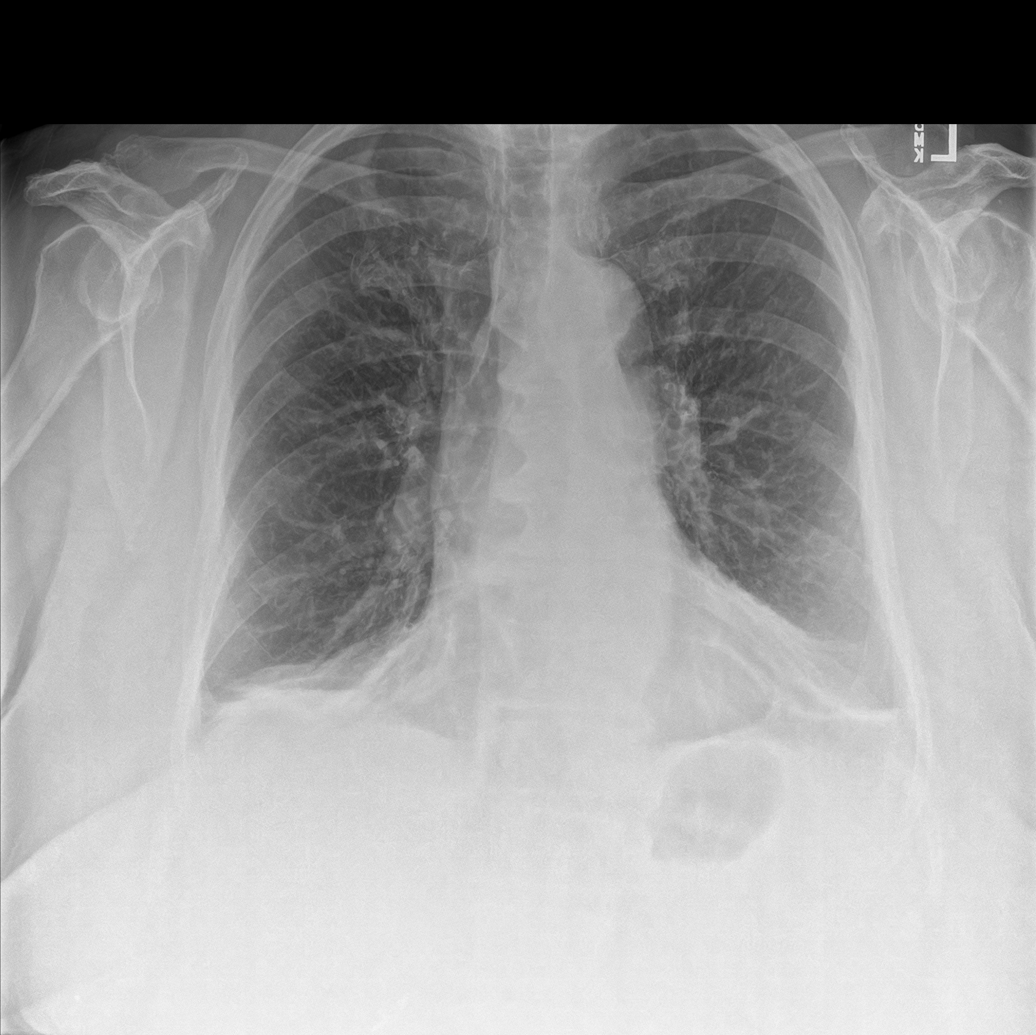
[im 2/6]
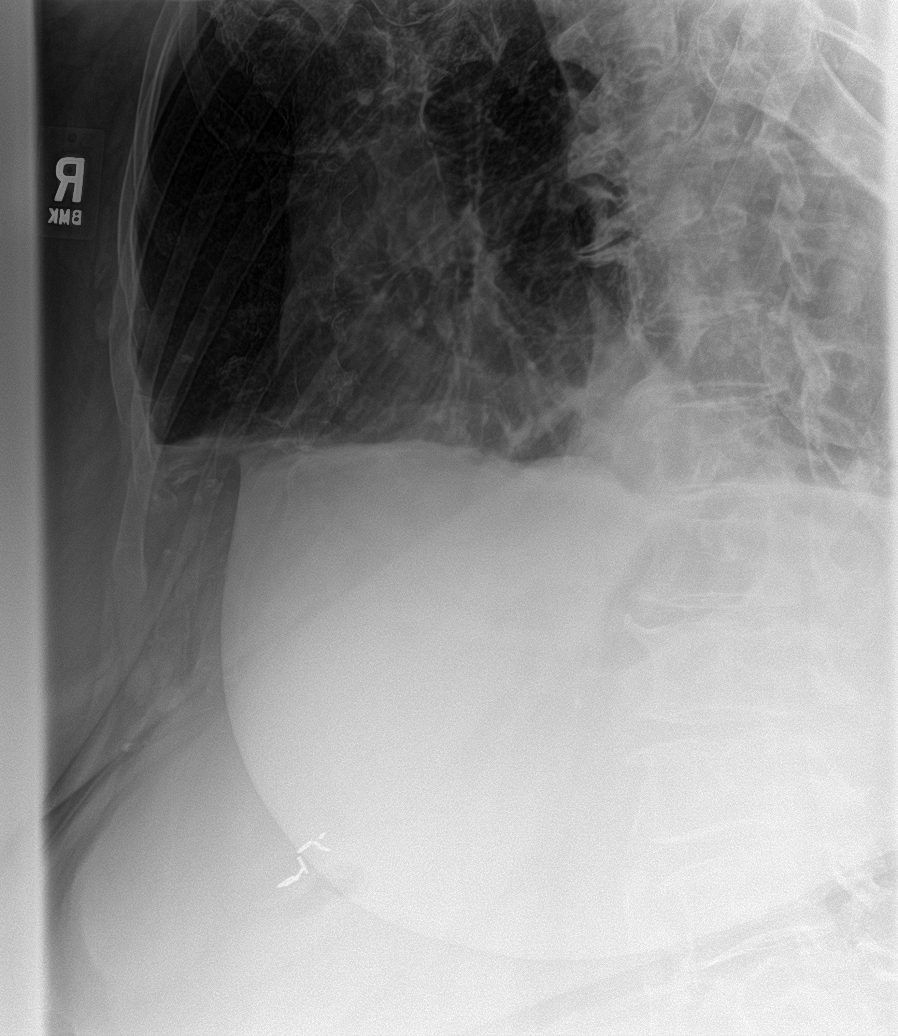
[im 3/6]
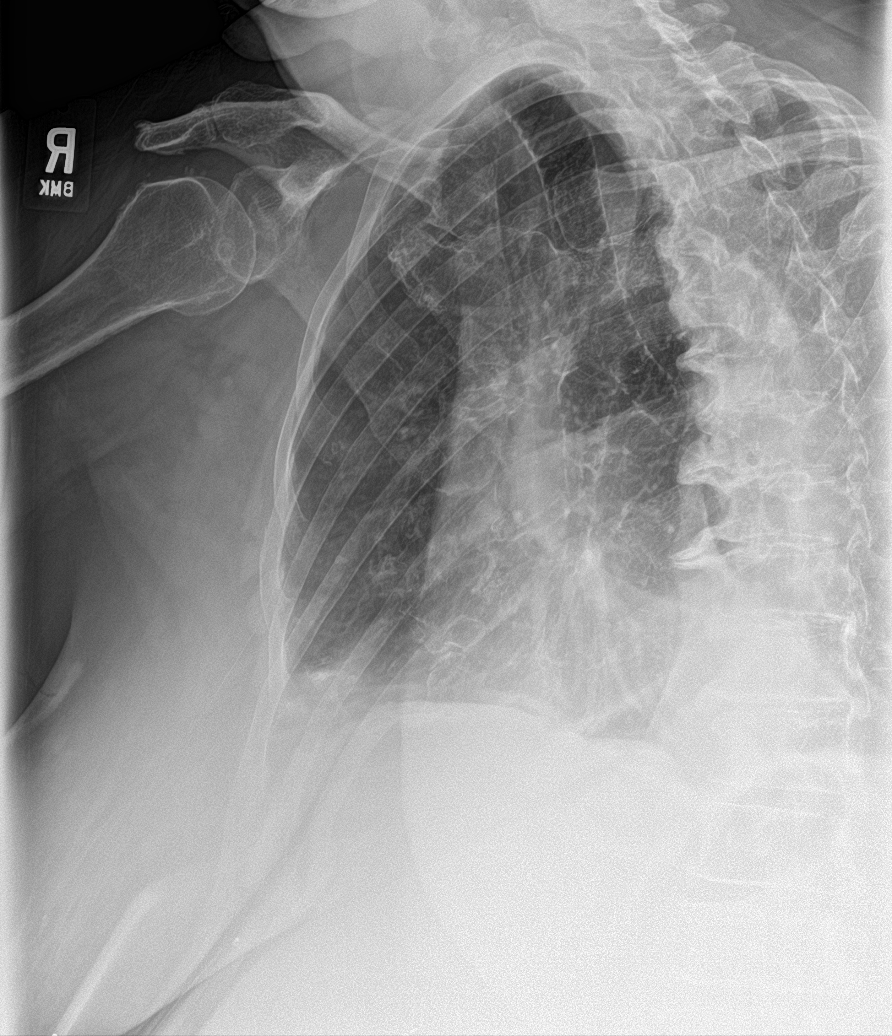
[im 4/6]
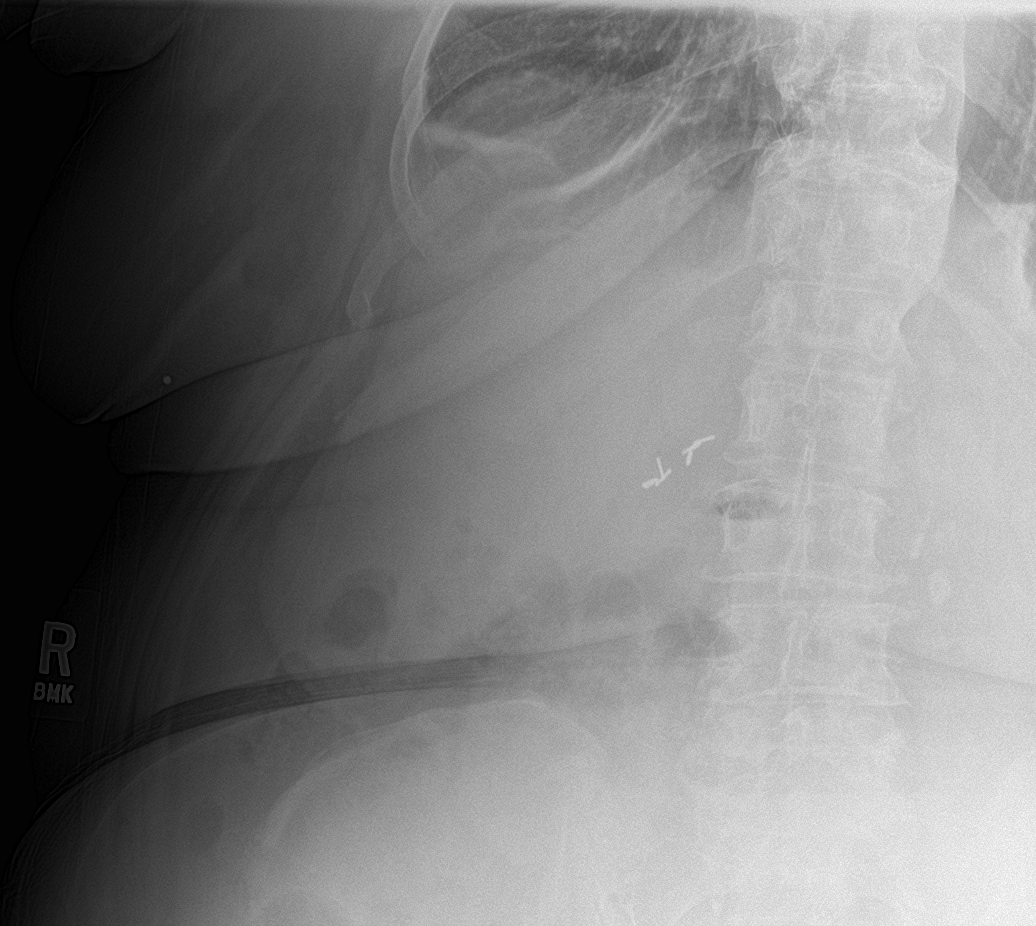
[im 5/6]
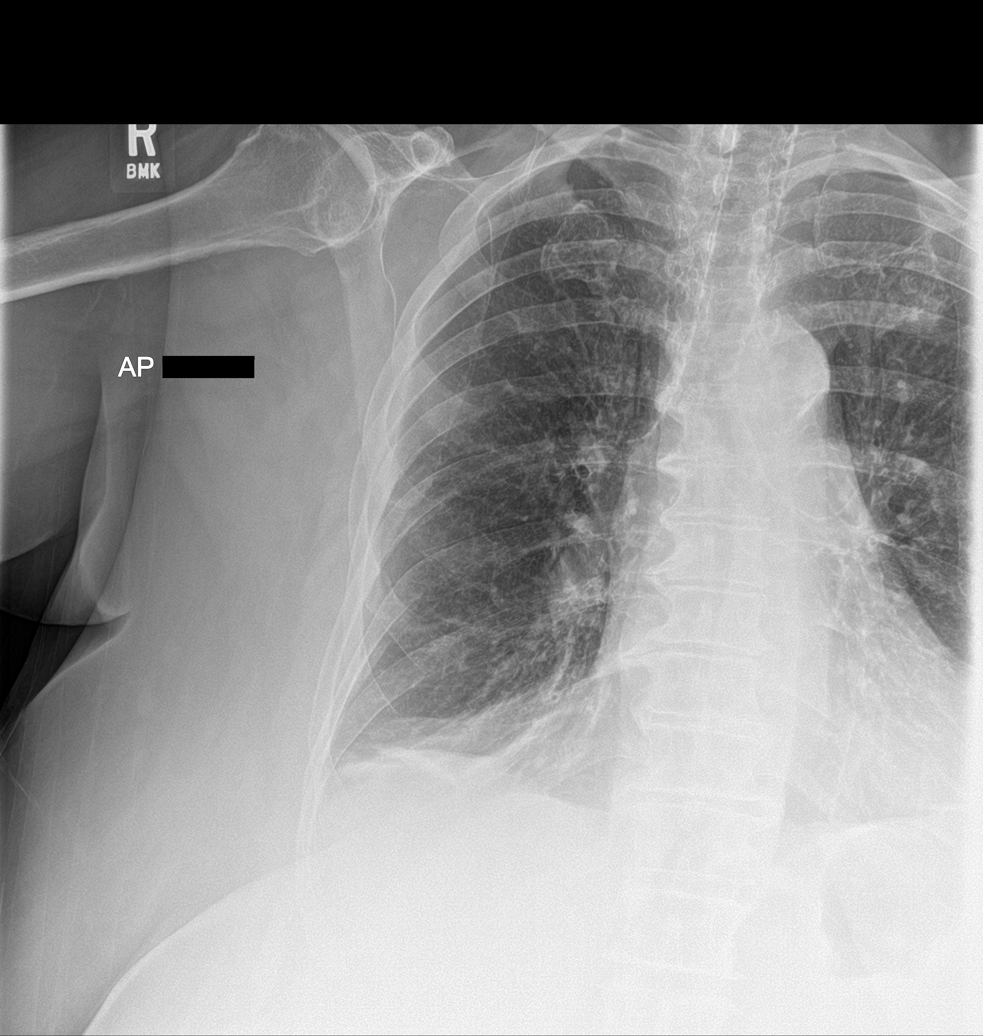
[im 6/6]
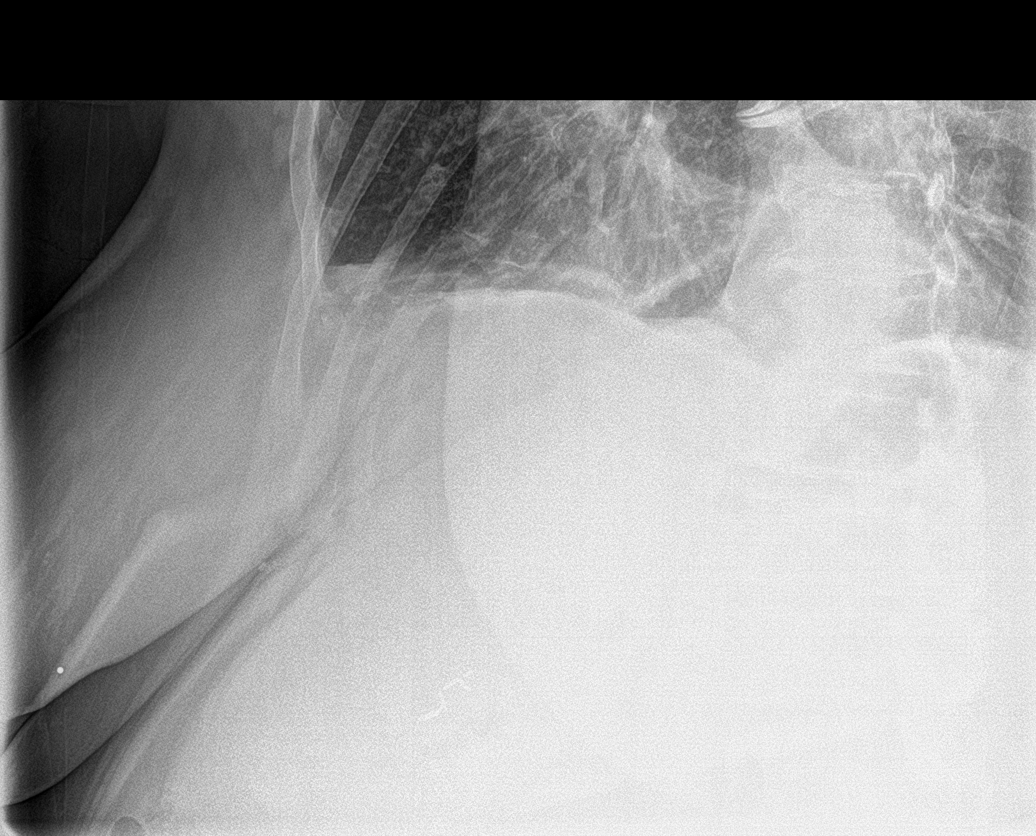

[6 of 6 positions shown; findings below may reference images not displayed]

FINDINGS: Acute anterior right eighth and tenth rib fractures are identified
with minimal displacement of the tenth rib. Atelectasis is noted at
each lung base. Heart is top-normal in size. There is slight
uncoiling of the thoracic aorta. No pneumothorax or effusion.
IMPRESSION: Acute anterior right eighth and tenth rib fractures with
atelectasis. No pneumothorax or effusion.
